# Patient Record
Sex: Female | Born: 1975 | Race: Black or African American | Hispanic: No | Marital: Single | Smoking: Never smoker
Health system: Southern US, Community
[De-identification: ages and names within clinical notes are randomized; demographics above are authoritative.]

## PROBLEM LIST (undated history)

## (undated) DIAGNOSIS — J45909 Unspecified asthma, uncomplicated: Secondary | ICD-10-CM

## (undated) DIAGNOSIS — T7840XA Allergy, unspecified, initial encounter: Secondary | ICD-10-CM

## (undated) DIAGNOSIS — F32A Depression, unspecified: Secondary | ICD-10-CM

## (undated) HISTORY — DX: Depression, unspecified: F32.A

## (undated) HISTORY — DX: Allergy, unspecified, initial encounter: T78.40XA

## (undated) HISTORY — DX: Unspecified asthma, uncomplicated: J45.909

---

## 1997-12-01 ENCOUNTER — Other Ambulatory Visit: Admission: RE | Admit: 1997-12-01 | Discharge: 1997-12-01 | Payer: Self-pay | Admitting: Obstetrics and Gynecology

## 1999-10-20 ENCOUNTER — Other Ambulatory Visit: Admission: RE | Admit: 1999-10-20 | Discharge: 1999-10-20 | Payer: Self-pay | Admitting: Obstetrics and Gynecology

## 2000-10-21 ENCOUNTER — Other Ambulatory Visit: Admission: RE | Admit: 2000-10-21 | Discharge: 2000-10-21 | Payer: Self-pay | Admitting: Obstetrics and Gynecology

## 2002-04-28 ENCOUNTER — Other Ambulatory Visit: Admission: RE | Admit: 2002-04-28 | Discharge: 2002-04-28 | Payer: Self-pay | Admitting: Obstetrics and Gynecology

## 2004-04-20 ENCOUNTER — Other Ambulatory Visit: Admission: RE | Admit: 2004-04-20 | Discharge: 2004-04-20 | Payer: Self-pay | Admitting: Obstetrics and Gynecology

## 2007-12-10 ENCOUNTER — Emergency Department (HOSPITAL_BASED_OUTPATIENT_CLINIC_OR_DEPARTMENT_OTHER): Admission: EM | Admit: 2007-12-10 | Discharge: 2007-12-10 | Payer: Self-pay | Admitting: Emergency Medicine

## 2009-06-17 ENCOUNTER — Emergency Department (HOSPITAL_BASED_OUTPATIENT_CLINIC_OR_DEPARTMENT_OTHER): Admission: EM | Admit: 2009-06-17 | Discharge: 2009-06-18 | Payer: Self-pay | Admitting: Emergency Medicine

## 2009-06-18 ENCOUNTER — Ambulatory Visit: Payer: Self-pay | Admitting: Diagnostic Radiology

## 2010-04-25 ENCOUNTER — Encounter (HOSPITAL_COMMUNITY)
Admission: RE | Admit: 2010-04-25 | Discharge: 2010-04-25 | Disposition: A | Discharge status: Home / Self Care | Payer: Federal, State, Local not specified - PPO | Source: Ambulatory Visit | Attending: Obstetrics and Gynecology | Admitting: Obstetrics and Gynecology

## 2010-04-25 DIAGNOSIS — Z01812 Encounter for preprocedural laboratory examination: Secondary | ICD-10-CM | POA: Insufficient documentation

## 2010-04-25 LAB — CBC
MCHC: 32.7 g/dL (ref 30.0–36.0)
MCV: 93.2 fL (ref 78.0–100.0)
WBC: 6.6 10*3/uL (ref 4.0–10.5)

## 2010-04-25 LAB — SURGICAL PCR SCREEN
MRSA, PCR: NEGATIVE
Staphylococcus aureus: NEGATIVE

## 2010-05-02 ENCOUNTER — Inpatient Hospital Stay (HOSPITAL_COMMUNITY)
Admission: RE | Admit: 2010-05-02 | Discharge: 2010-05-04 | DRG: 371 | Disposition: A | Discharge status: Home / Self Care | Payer: Federal, State, Local not specified - PPO | Source: Ambulatory Visit | Attending: Obstetrics and Gynecology | Admitting: Obstetrics and Gynecology

## 2010-05-02 DIAGNOSIS — O34219 Maternal care for unspecified type scar from previous cesarean delivery: Principal | ICD-10-CM | POA: Diagnosis present

## 2010-05-03 LAB — CBC
HCT: 27.7 % — ABNORMAL LOW (ref 36.0–46.0)
Hemoglobin: 9.2 g/dL — ABNORMAL LOW (ref 12.0–15.0)
MCH: 30.8 pg (ref 26.0–34.0)
MCHC: 33.2 g/dL (ref 30.0–36.0)
Platelets: 119 10*3/uL — ABNORMAL LOW (ref 150–400)
RDW: 14.1 % (ref 11.5–15.5)

## 2010-05-05 NOTE — Discharge Summary (Signed)
  NAMEGERMANY, DODGEN NO.:  [id number]  MEDICAL RECORD NO.:  [id number]           PATIENT TYPE:  I  LOCATION:  9112                          FACILITY:  [facility]  PHYSICIAN:  Gerrit Friends. Aldona Bar, M.D.   DATE OF BIRTH:  1976/02/07  DATE OF ADMISSION:  05/02/2010 DATE OF DISCHARGE:  05/04/2010                              DISCHARGE SUMMARY   DISCHARGE DIAGNOSES: 1. Term pregnancy delivered 8-pound 13-ounce female infant, Apgars 9 and     9. 2. Blood type B positive. 3. Previous cesarean section.  PROCEDURE:  Repeat low transverse cesarean section at term.  SUMMARY:  This 35 year old gravida 2 now para 2 was admitted at term for an elective repeat cesarean section which was carried out by Dr. Henderson Cloud on February 28 without difficulty with delivery of an 8-pound 13-ounce female infant with Apgars of 9 and 9.  The patient's postpartum course was totally uncomplicated.  She was breastfeeding without difficulty.  On the morning of March 1 she was very desirous of discharge.  She was ambulating well, tolerating a regular diet well, having normal bowel and bladder function, was afebrile, vital signs were stable.  Her breastfeeding was going well.  Her incision was clean and dry.  Her hemoglobin was 9.2 on February 29 with a white count of 8000 and a platelet count of 119,000.  It was felt being that this was only day 2, it will be appropriate to leave her staples in and she will return to the office for followup in 3 or 4 days to have her staples removed and wound Steri-Stripped.  She was given a staple remover and Steri-Strips to bring in the office and she will call for an appointment accordingly.  She was given an instruction brochure at the time of discharge and understood all instructions well. Discharge medications include vitamins 1 a day as long as she is breastfeeding, Feosol capsules in the equivalent thereof one daily until at least her postpartum visit, and she was  given prescriptions for Tylox to use 1-2 every 4-6 hours as needed for severe pain and Motrin 600 mg to use every 6 hours as needed for cramping or mild pain.  As mentioned, she was given an instruction brochure at the time of discharge and understood all instructions well.  CONDITION ON DISCHARGE:  Improved.     Gerrit Friends. Aldona Bar, M.D.     RMW/MEDQ  D:  05/04/2010  T:  05/04/2010  Job:  [id number]  Electronically Signed by Annamaria Helling M.D. on 05/05/2010 09:13:43 AM

## 2010-05-08 ENCOUNTER — Inpatient Hospital Stay (HOSPITAL_COMMUNITY): Admission: AD | Admit: 2010-05-08 | Payer: Self-pay | Source: Home / Self Care | Admitting: Obstetrics and Gynecology

## 2010-05-08 NOTE — Op Note (Signed)
NAMEGRACIE, GUPTA NO.:  [id number]  MEDICAL RECORD NO.:  [id number]           PATIENT TYPE:  I  LOCATION:  9112                          [facility]ACILITY:  [facility]  PHYSICIAN:  Carrington Clamp, M.D. DATE OF BIRTH:  [birth year]  DATE OF PROCEDURE:  05/02/2010 DATE OF DISCHARGE:                              OPERATIVE REPORT   PREOPERATIVE DIAGNOSIS:  Repeat cesarean section at term.  POSTOPERATIVE DIAGNOSIS:  Repeat cesarean section at term.  PROCEDURE:  Low transverse cesarean section.  SURGEON:  Carrington Clamp, MD  ASSISTANT:  Leilani Able, [facility]  ANESTHESIA:  Spinal.  FINDINGS:  Female infant 8 pounds 13 ounces, Apgars 9 and 9, vertex presentation.  Normal tubes, ovaries, and uterus were otherwise seen.  SPECIMENS:  None.  MEDICATIONS:  Ancef and Pitocin.  ESTIMATED BLOOD LOSS:  800 mL.  IV FLUIDS:  2300 mL.  URINE OUTPUT:  300 mL.  COMPLICATIONS:  None.  TECHNIQUE:  After adequate spinal anesthesia was achieved, the patient was prepped and draped in the usual sterile fashion in the dorsal lithotomy position.  A Pfannenstiel skin incision was made with the scalpel and carried down to the fascia with a Bovie cautery.  The fascia was incised in the midline and carried in a transverse curvilinear manner with the Mayo scissors.  The fascia was reflected superiorly and inferiorly from the rectus muscles.  The rectus muscles split in the midline.  Bowel free portion of peritoneum was then entered into carefully bluntly and the peritoneum was then extended superior and inferior manner with good visualization of the bowel and bladder.  The Alexis instrument was then placed and the vesicouterine fascia tented up and incised in a transverse curvilinear manner.  The bladder flap was created with sharp and blunt dissection.  A 2-cm incision was made in the upper portion of the lower uterine segment until clear fluid was noted on the amnion on entry to the  amnion.  The incision was extended manually and with the bandage scissors in a transverse curvilinear manner.  The baby was identified in the vertex presentation and delivered without complication.  The baby was bulb suctioned.  The cord was clamped and cut, and the baby was handed to awaiting pediatrics.  The placenta was then delivered manually and cord bloods were obtained by the cord blood donation staff.  The uterus was cleared of all debris, and the uterine incision was then closed with a running lock stitch of 0 Monocryl.  An imbricating layer of 0 Monocryl was performed as well.  The uterine incision was rendered hemostatic with the Bovie cautery and inspection of the ovaries and tubes indicated that they were normal.  Irrigation was then performed, and the peritoneum was then closed with a running stitch of 2-0 Vicryl.  This incorporated the rectus muscles as a separate layer.  The fascia was then closed with running stitch of 0 Vicryl.  The subcutaneous tissue was revised with Bovie cautery and then rendered hemostatic with irrigation and Bovie cautery.  The subcutaneous layer was then closed with interrupted stitches of 2-0 plain gut.  The skin was closed with staples.  The  patient tolerated the procedure well and was returned to recovery room in stable condition.     Carrington Clamp, M.D.     MH/MEDQ  D:  05/02/2010  T:  05/02/2010  Job:  [id number]  Electronically Signed by Carrington Clamp MD on 05/08/2010 08:28:49 AM

## 2010-05-23 LAB — BASIC METABOLIC PANEL
CO2: 25 mEq/L (ref 19–32)
Chloride: 104 mEq/L (ref 96–112)
GFR calc Af Amer: >60 mL/min (ref >60)
GFR calc non Af Amer: >60 mL/min (ref >60)
Potassium: 3.9 mEq/L (ref 3.5–5.1)

## 2010-05-23 LAB — CBC
HCT: 41.8 % (ref 36.0–46.0)
Hemoglobin: 14 g/dL (ref 12.0–15.0)
MCV: 93.3 fL (ref 78.0–100.0)
RDW: 12.5 % (ref 11.5–15.5)
WBC: 4.6 10*3/uL (ref 4.0–10.5)

## 2010-05-23 LAB — DIFFERENTIAL
Eosinophils Absolute: 0 10*3/uL (ref 0.0–0.7)
Lymphocytes Relative: 18 % (ref 12–46)
Lymphs Abs: 0.8 10*3/uL (ref 0.7–4.0)
Monocytes Relative: 11 % (ref 3–12)
Neutro Abs: 3.3 10*3/uL (ref 1.7–7.7)
Neutrophils Relative %: 70 % (ref 43–77)

## 2010-05-23 LAB — URINALYSIS, ROUTINE W REFLEX MICROSCOPIC
Leukocytes, UA: NEGATIVE
Nitrite: NEGATIVE
Specific Gravity, Urine: 1.03 (ref 1.005–1.030)
Urobilinogen, UA: 1 mg/dL (ref 0.0–1.0)

## 2010-05-23 LAB — PREGNANCY, URINE: Preg Test, Ur: NEGATIVE

## 2010-05-23 LAB — URINE MICROSCOPIC-ADD ON

## 2011-10-10 IMAGING — CR DG ABDOMEN ACUTE W/ 1V CHEST
3 series · 3 of 3 positions shown · non-contrast
Comparison: None.

CLINICAL DATA: Abdominal pain.  Nausea and vomiting.  Diarrhea.  1-
day history of these symptoms.

ACUTE ABDOMEN SERIES (ABDOMEN 2 VIEW & CHEST 1 VIEW) 06/18/2009:

[w chest pa]
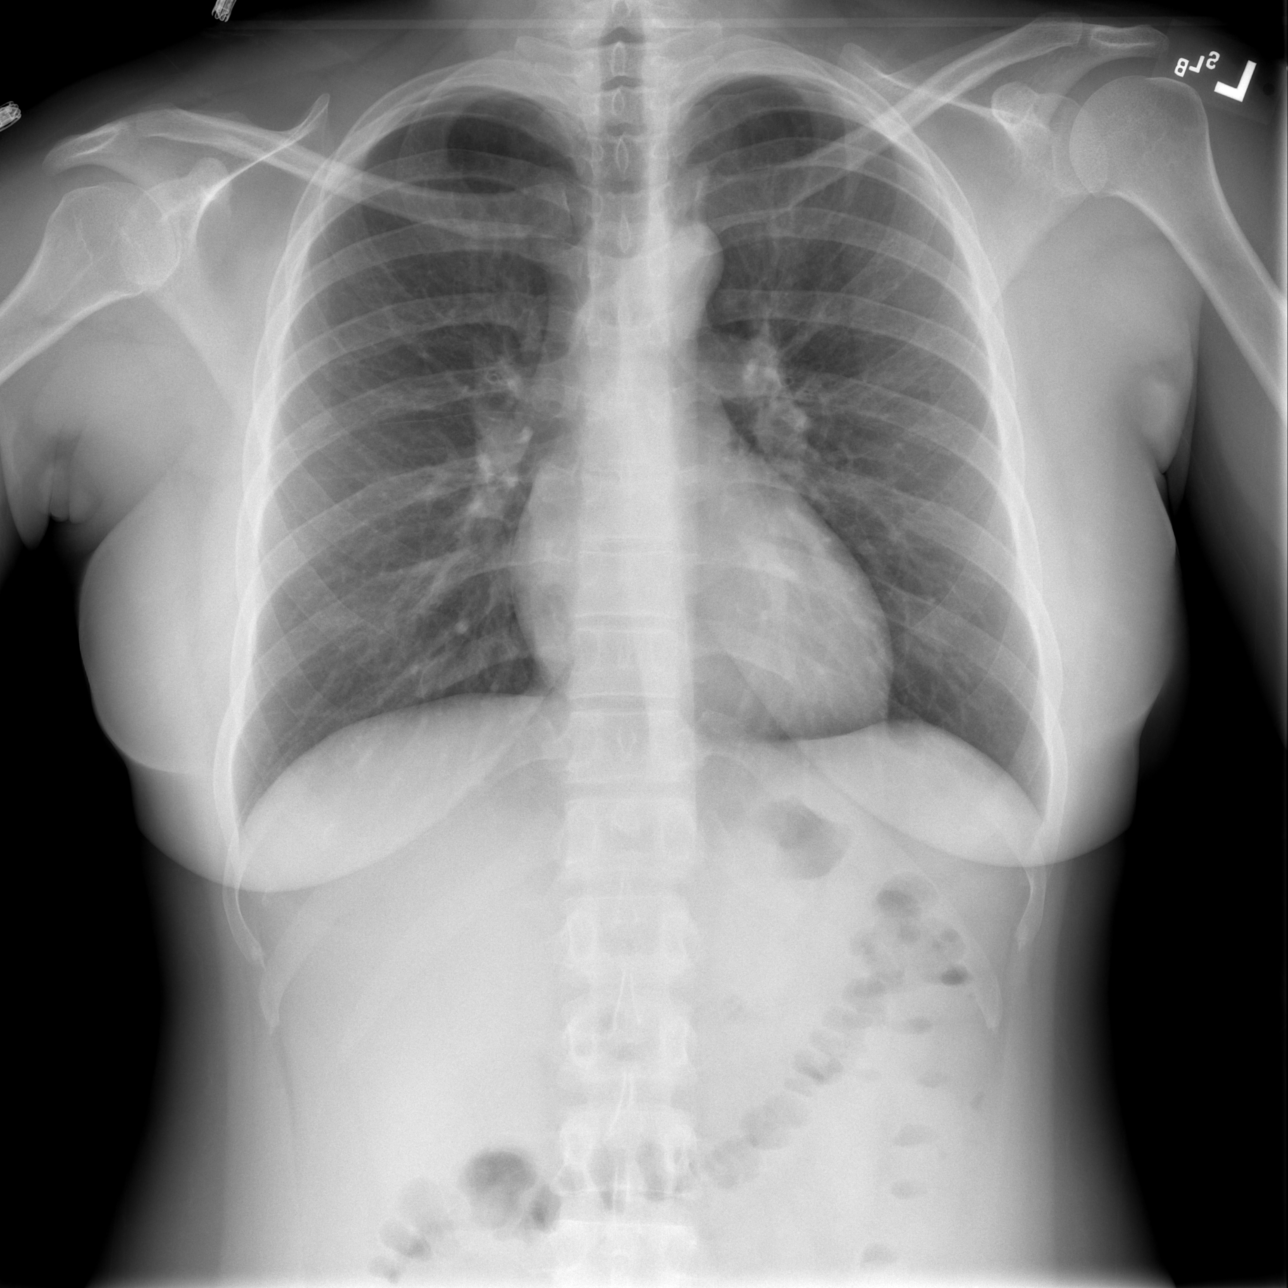

[w abdomen upright]
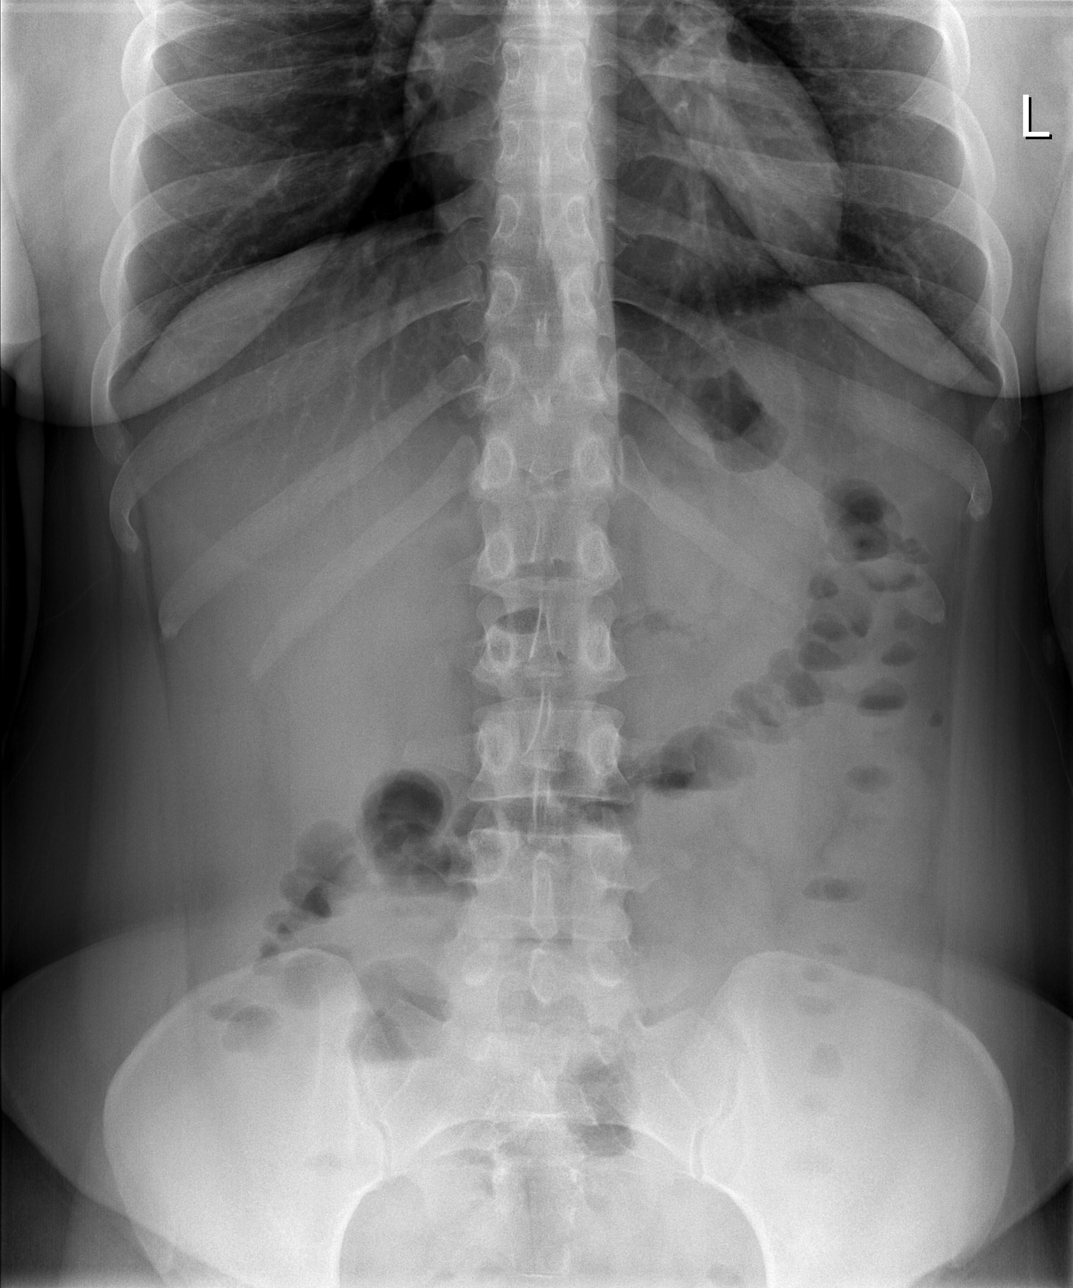

[t abdomen supine]
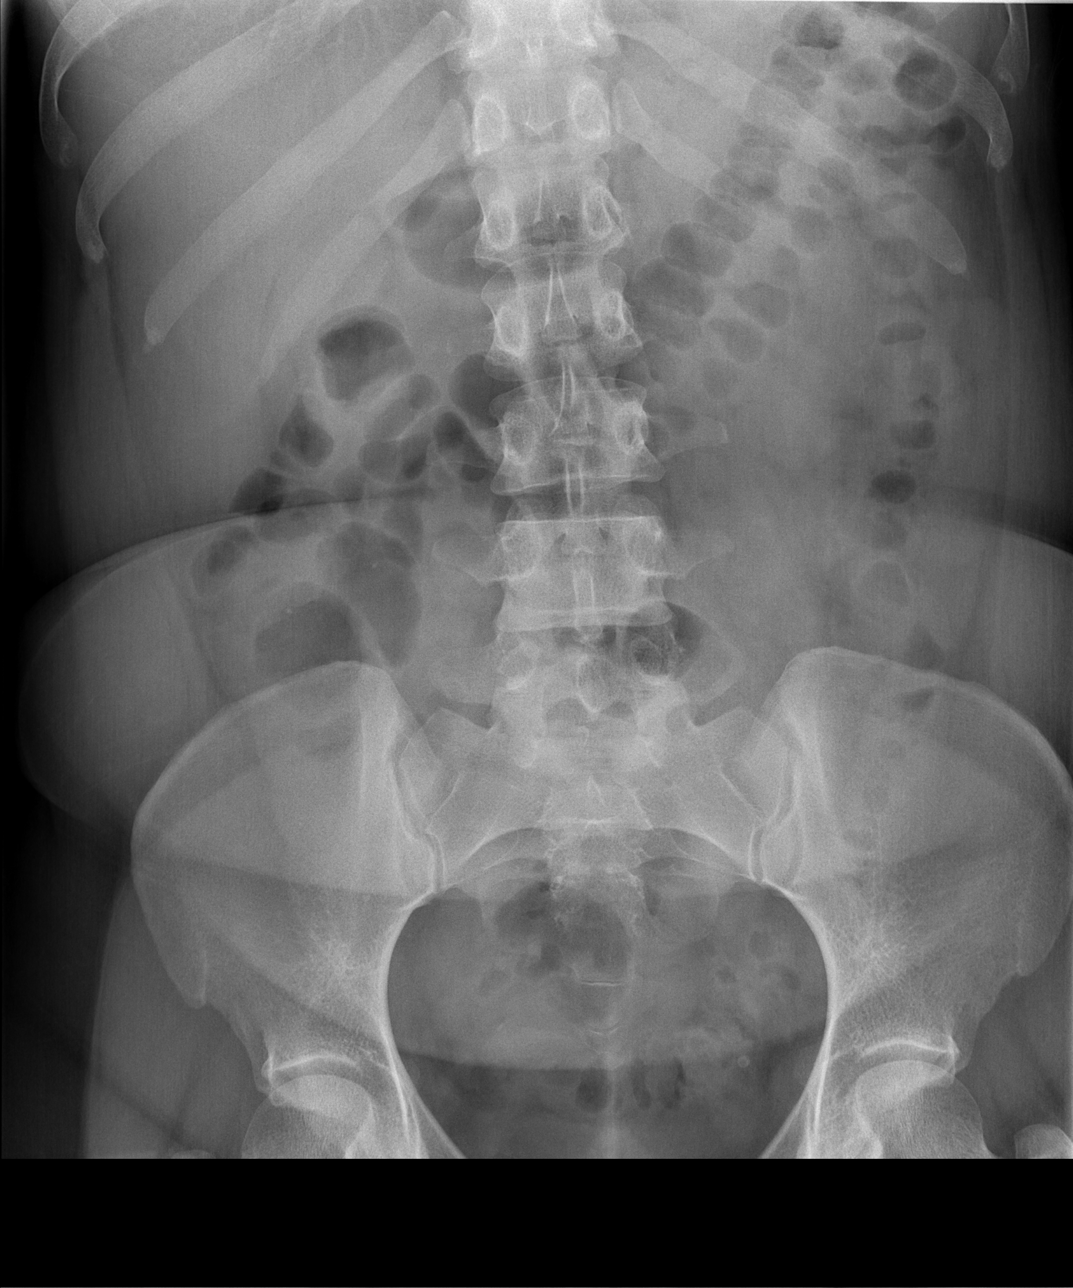

[3 of 3 positions shown; findings below may reference images not displayed]

FINDINGS: Bowel gas pattern unremarkable without evidence of
obstruction or significant ileus.  Air-fluid levels throughout the
colon consistent with liquid stool.  No evidence of free
intraperitoneal air or significant air fluid levels on the erect
image.  No abnormal calcifications.  Regional skeleton intact.

Cardiomediastinal silhouette unremarkable.  Lungs clear.  No
pleural effusions.
IMPRESSION: No acute abdominal or pulmonary abnormalities.  Liquid stool in the
colon consistent with the history of diarrhea.

## 2015-12-21 DIAGNOSIS — J45909 Unspecified asthma, uncomplicated: Secondary | ICD-10-CM | POA: Insufficient documentation

## 2016-12-20 DIAGNOSIS — Z01419 Encounter for gynecological examination (general) (routine) without abnormal findings: Secondary | ICD-10-CM | POA: Diagnosis not present

## 2016-12-20 DIAGNOSIS — Z124 Encounter for screening for malignant neoplasm of cervix: Secondary | ICD-10-CM | POA: Diagnosis not present

## 2016-12-20 DIAGNOSIS — Z6832 Body mass index (BMI) 32.0-32.9, adult: Secondary | ICD-10-CM | POA: Diagnosis not present

## 2017-05-28 DIAGNOSIS — K080 Exfoliation of teeth due to systemic causes: Secondary | ICD-10-CM | POA: Diagnosis not present

## 2017-07-02 DIAGNOSIS — K080 Exfoliation of teeth due to systemic causes: Secondary | ICD-10-CM | POA: Diagnosis not present

## 2017-12-17 DIAGNOSIS — K080 Exfoliation of teeth due to systemic causes: Secondary | ICD-10-CM | POA: Diagnosis not present

## 2018-04-07 DIAGNOSIS — S99922A Unspecified injury of left foot, initial encounter: Secondary | ICD-10-CM | POA: Diagnosis not present

## 2018-05-23 ENCOUNTER — Ambulatory Visit: Payer: Self-pay | Admitting: Family Medicine

## 2018-07-23 DIAGNOSIS — Z124 Encounter for screening for malignant neoplasm of cervix: Secondary | ICD-10-CM | POA: Diagnosis not present

## 2018-07-23 DIAGNOSIS — Z1231 Encounter for screening mammogram for malignant neoplasm of breast: Secondary | ICD-10-CM | POA: Diagnosis not present

## 2018-07-23 DIAGNOSIS — Z6836 Body mass index (BMI) 36.0-36.9, adult: Secondary | ICD-10-CM | POA: Diagnosis not present

## 2018-07-23 DIAGNOSIS — Z01419 Encounter for gynecological examination (general) (routine) without abnormal findings: Secondary | ICD-10-CM | POA: Diagnosis not present

## 2018-09-01 ENCOUNTER — Other Ambulatory Visit: Payer: Self-pay

## 2018-09-01 ENCOUNTER — Encounter: Payer: Self-pay | Admitting: Family Medicine

## 2018-09-01 ENCOUNTER — Ambulatory Visit: Payer: Federal, State, Local not specified - PPO | Admitting: Family Medicine

## 2018-09-01 VITALS — BP 127/79 | HR 89 | Temp 98.5°F | Resp 16 | Ht 61.0 in | Wt 196.4 lb

## 2018-09-01 DIAGNOSIS — Z1329 Encounter for screening for other suspected endocrine disorder: Secondary | ICD-10-CM

## 2018-09-01 DIAGNOSIS — Z13220 Encounter for screening for lipoid disorders: Secondary | ICD-10-CM | POA: Diagnosis not present

## 2018-09-01 DIAGNOSIS — J4520 Mild intermittent asthma, uncomplicated: Secondary | ICD-10-CM

## 2018-09-01 DIAGNOSIS — Z131 Encounter for screening for diabetes mellitus: Secondary | ICD-10-CM

## 2018-09-01 DIAGNOSIS — Z136 Encounter for screening for cardiovascular disorders: Secondary | ICD-10-CM

## 2018-09-01 DIAGNOSIS — Z6837 Body mass index (BMI) 37.0-37.9, adult: Secondary | ICD-10-CM

## 2018-09-01 DIAGNOSIS — E6609 Other obesity due to excess calories: Secondary | ICD-10-CM | POA: Diagnosis not present

## 2018-09-01 MED ORDER — ALBUTEROL SULFATE HFA 108 (90 BASE) MCG/ACT IN AERS: 1.0000 | INHALATION_SPRAY | Freq: Four times a day (QID) | RESPIRATORY_TRACT | 1 refills | Status: DC | PRN

## 2018-09-01 NOTE — Patient Instructions (Signed)
° ° ° °  If you have lab work done today you will be contacted with your lab results within the next 2 weeks.  If you have not heard from us then please contact us. The fastest way to get your results is to register for My Chart. ° ° °IF you received an x-ray today, you will receive an invoice from [facility]. Please contact [facility] at [phone] with questions or concerns regarding your invoice.  ° °IF you received labwork today, you will receive an invoice from [facility]. Please contact [facility] at [phone] with questions or concerns regarding your invoice.  ° °Our billing staff will not be able to assist you with questions regarding bills from these companies. ° °You will be contacted with the lab results as soon as they are available. The fastest way to get your results is to activate your My Chart account. Instructions are located on the last page of this paperwork. If you have not heard from us regarding the results in 2 weeks, please contact this office. °  ° ° ° °

## 2018-09-01 NOTE — Progress Notes (Signed)
New Patient Office Visit  Subjective:  Patient ID: Rebecca Kim, female    DOB: 07-06-1975  Age: 43 y.o. MRN: [id number]  CC:  Chief Complaint  Patient presents with  . Establish Care    has no other issues    HPI Rebecca Kim presents for   Obesity Pt reports that she has not been exercising She has Rebecca Kim 41yo girl and Rebecca Kim 41 yo boy She is not exercising  She works from home She denies any chest pain, nausea or vomiting Body mass index is 37.11 kg/m. She denies family history of heart disease or diabetes She denies history of gestational diabetes   MILD ASTHMA She has a mild intermittent asthma She uses the albuterol prn Denies any recent exacerbations She is a nonsmoker   Past Medical History:  Diagnosis Date  . Asthma     Past Surgical History:  Procedure Laterality Date  . CESAREAN SECTION     x2    History reviewed. No pertinent family history.  Social History   Socioeconomic History  . Marital status: Married    Spouse name: Not on file  . Number of children: Not on file  . Years of education: Not on file  . Highest education level: Not on file  Occupational History  . Not on file  Social Needs  . Financial resource strain: Not hard at all  . Food insecurity    Worry: Never true    Inability: Never true  . Transportation needs    Medical: No    Non-medical: No  Tobacco Use  . Smoking status: Never Smoker  . Smokeless tobacco: Never Used  Substance and Sexual Activity  . Alcohol use: Yes    Alcohol/week: 2.0 standard drinks    Types: 2 Glasses of wine per week  . Drug use: Never  . Sexual activity: Yes    Birth control/protection: I.U.D.  Lifestyle  . Physical activity    Days per week: 0 days    Minutes per session: 0 min  . Stress: Not at all  Relationships  . Social Herbalist on phone: Three times a week    Gets together: Three times a week    Attends religious service: Never    Active member of club or  organization: No    Attends meetings of clubs or organizations: Never    Relationship status: Married  . Intimate partner violence    Fear of current or ex partner: No    Emotionally abused: No    Physically abused: No    Forced sexual activity: No  Other Topics Concern  . Not on file  Social History Narrative  . Not on file    ROS Review of Systems Review of Systems  Constitutional: Negative for activity change, appetite change, chills and fever.  HENT: Negative for congestion, nosebleeds, trouble swallowing and voice change.   Respiratory: Negative for cough, shortness of breath and wheezing.   Gastrointestinal: Negative for diarrhea, nausea and vomiting.  Genitourinary: Negative for difficulty urinating, dysuria, flank pain and hematuria.  Musculoskeletal: Negative for back pain, joint swelling and neck pain.  Neurological: Negative for dizziness, speech difficulty, light-headedness and numbness.  See HPI. All other review of systems negative.   Objective:   Today's Vitals: BP 127/79 (BP Location: Right Arm, Patient Position: Sitting, Cuff Size: Normal)   Pulse 89   Temp 98.5 F (36.9 C) (Oral)   Resp 16   Ht 5\' 1"  (1.549 m)  Wt 196 lb 6.4 oz (89.1 kg)   SpO2 98%   BMI 37.11 kg/m   Physical Exam  Physical Exam  Constitutional: Oriented to person, place, and time. Appears well-developed and well-nourished.  HENT:  Head: Normocephalic and atraumatic.  Eyes: Conjunctivae and EOM are normal.  Neck: no thyromegaly, neck supple Cardiovascular: Normal rate, regular rhythm, normal heart sounds and intact distal pulses.  No murmur heard. Pulmonary/Chest: Effort normal and breath sounds normal. No stridor. No respiratory distress. Has no wheezes.  Neurological: Is alert and oriented to person, place, and time.  Skin: Skin is warm. Capillary refill takes less than 2 seconds.  Psychiatric: Has a normal mood and affect. Behavior is normal. Judgment and thought content  normal.   Assessment & Plan:   Problem List Items Addressed This Visit    None    Visit Diagnoses    Class 2 obesity due to excess calories without serious comorbidity with body mass index (BMI) of 37.0 to 37.9 in adult    -  Primary   Screening for diabetes mellitus       Relevant Orders   Hemoglobin A1c   Basic metabolic panel   Encounter for lipid screening for cardiovascular disease       Relevant Orders   Lipid panel   Screening for thyroid disorder       Relevant Orders   TSH     Will screen for diabetes and thyroid disease as well as cholesterol  For obesity- discussed goal of daily exercise for at least 15 minutes 3 times a day when possible Using chair exercises on youtube, doing some cardio and walking in the evenings  Mild intermittent asthma - refilled her inhaler today for prn use  Outpatient Encounter Medications as of 09/01/2018  Medication Sig  . albuterol (PROAIR HFA) 108 (90 Base) MCG/ACT inhaler ProAir HFA 90 mcg/actuation aerosol inhaler  . fluticasone (FLONASE) 50 MCG/ACT nasal spray fluticasone propionate 50 mcg/actuation nasal spray,suspension  . Fluticasone-Salmeterol (ADVAIR DISKUS) 250-50 MCG/DOSE AEPB Advair Diskus 250 mcg-50 mcg/dose powder for inhalation   No facility-administered encounter medications on file as of 09/01/2018.     Follow-up: No follow-ups on file.   Doristine BosworthZoe A Myrick Mcnairy, MD

## 2018-09-02 ENCOUNTER — Encounter: Payer: Self-pay | Admitting: Family Medicine

## 2018-09-02 LAB — BASIC METABOLIC PANEL
BUN/Creatinine Ratio: 10 (ref 9–23)
BUN: 7 mg/dL (ref 6–24)
CO2: 20 mmol/L (ref 20–29)
Calcium: 8.9 mg/dL (ref 8.7–10.2)
Chloride: 103 mmol/L (ref 96–106)
Creatinine, Ser: 0.68 mg/dL (ref 0.57–1.00)
GFR calc Af Amer: 126 mL/min/{1.73_m2} (ref >59)
GFR calc non Af Amer: 109 mL/min/{1.73_m2} (ref >59)
Glucose: 98 mg/dL (ref 65–99)
Potassium: 4.3 mmol/L (ref 3.5–5.2)
Sodium: 137 mmol/L (ref 134–144)

## 2018-09-02 LAB — LIPID PANEL
Chol/HDL Ratio: 2.8 ratio (ref 0.0–4.4)
Cholesterol, Total: 163 mg/dL (ref 100–199)
HDL: 58 mg/dL (ref >39)
LDL Calculated: 96 mg/dL (ref 0–99)
Triglycerides: 43 mg/dL (ref 0–149)
VLDL Cholesterol Cal: 9 mg/dL (ref 5–40)

## 2018-09-02 LAB — HEMOGLOBIN A1C
Est. average glucose Bld gHb Est-mCnc: 100 mg/dL
Hgb A1c MFr Bld: 5.1 % (ref 4.8–5.6)

## 2018-09-02 LAB — TSH: TSH: 1.39 u[IU]/mL (ref 0.450–4.500)

## 2018-09-24 DIAGNOSIS — Z20828 Contact with and (suspected) exposure to other viral communicable diseases: Secondary | ICD-10-CM | POA: Diagnosis not present

## 2018-12-25 ENCOUNTER — Encounter: Payer: Self-pay | Admitting: Family Medicine

## 2019-01-22 ENCOUNTER — Ambulatory Visit (INDEPENDENT_AMBULATORY_CARE_PROVIDER_SITE_OTHER): Payer: Federal, State, Local not specified - PPO | Admitting: Family Medicine

## 2019-01-22 ENCOUNTER — Other Ambulatory Visit: Payer: Self-pay

## 2019-01-22 DIAGNOSIS — Z23 Encounter for immunization: Secondary | ICD-10-CM | POA: Diagnosis not present

## 2019-01-26 DIAGNOSIS — Z20828 Contact with and (suspected) exposure to other viral communicable diseases: Secondary | ICD-10-CM | POA: Diagnosis not present

## 2019-03-26 ENCOUNTER — Encounter: Payer: Self-pay | Admitting: Family Medicine

## 2019-07-07 ENCOUNTER — Encounter: Payer: Self-pay | Admitting: Sports Medicine

## 2019-07-07 ENCOUNTER — Ambulatory Visit: Payer: Federal, State, Local not specified - PPO | Admitting: Sports Medicine

## 2019-07-07 ENCOUNTER — Other Ambulatory Visit: Payer: Self-pay

## 2019-07-07 ENCOUNTER — Other Ambulatory Visit: Payer: Self-pay | Admitting: Sports Medicine

## 2019-07-07 ENCOUNTER — Ambulatory Visit (INDEPENDENT_AMBULATORY_CARE_PROVIDER_SITE_OTHER): Payer: Federal, State, Local not specified - PPO

## 2019-07-07 VITALS — Temp 97.1°F

## 2019-07-07 DIAGNOSIS — M722 Plantar fascial fibromatosis: Secondary | ICD-10-CM

## 2019-07-07 DIAGNOSIS — M79672 Pain in left foot: Secondary | ICD-10-CM

## 2019-07-07 DIAGNOSIS — M2140 Flat foot [pes planus] (acquired), unspecified foot: Secondary | ICD-10-CM | POA: Diagnosis not present

## 2019-07-07 MED ORDER — TRIAMCINOLONE ACETONIDE 10 MG/ML IJ SUSP
10.0000 mg | Freq: Once | INTRAMUSCULAR | Status: AC
  Administered 2019-07-07: 21:00:00 10 mg

## 2019-07-07 NOTE — Progress Notes (Signed)
Subjective: Rebecca Kim is a 44 y.o. female patient presents to office with complaint of moderate heel pain on the left for the last 2 months. Patient admits to post static dyskinesia and pain with walking. History of plantar fascial release 21 years ago bilateral but most recently started to have pain again since working from home and walking barefoot. Patient has treated this problem with stretching, change in shoes and Dr. Jari Sportsman insoles with no relief. Denies any other pedal complaints.   Review of Systems  All other systems reviewed and are negative.   Patient Active Problem List   Diagnosis Date Noted  . Asthma 12/21/2015  . Routine health maintenance 11/18/2010  . Breastfeeding (infant) 11/02/2010    Current Outpatient Medications on File Prior to Visit  Medication Sig Dispense Refill  . albuterol (PROAIR HFA) 108 (90 Base) MCG/ACT inhaler Inhale 1-2 puffs into the lungs every 6 (six) hours as needed for wheezing or shortness of breath. 18 g 1  . fluticasone (FLONASE) 50 MCG/ACT nasal spray fluticasone propionate 50 mcg/actuation nasal spray,suspension    . Fluticasone-Salmeterol (ADVAIR DISKUS) 250-50 MCG/DOSE AEPB Advair Diskus 250 mcg-50 mcg/dose powder for inhalation    . levonorgestrel (MIRENA, 52 MG,) 20 MCG/24HR IUD Mirena 20 mcg/24 hours (6 yrs) 52 mg intrauterine device  Take 1 device by intrauterine route.     No current facility-administered medications on file prior to visit.    Allergies  Allergen Reactions  . Pineapple Hives    Objective: Physical Exam General: The patient is alert and oriented x3 in no acute distress.  Dermatology: Skin is warm, dry and supple bilateral lower extremities. Nails 1-10 are normal. There is no erythema, edema, no eccymosis, no open lesions present. Integument is otherwise unremarkable.  Vascular: Dorsalis Pedis pulse and Posterior Tibial pulse are 2/4 bilateral. Capillary fill time is immediate to all  digits.  Neurological: Grossly intact to light touch with an achilles reflex of +2/5 and a  negative Tinel's sign bilateral.  Musculoskeletal: Tenderness to palpation at the medial calcaneal tubercale and through the insertion of the plantar fascia on the left foot. No pain with compression of calcaneus bilateral. No pain with tuning fork to calcaneus bilateral. No pain with calf compression bilateral. There is decreased Ankle joint range of motion bilateral. All other joints range of motion within normal limits bilateral. + Pes planus foot type. Strength 5/5 in all groups bilateral.   Gait: Unassisted, Antalgic avoid weight on left heel  Xray, Left foot:  Normal osseous mineralization. Joint spaces preserved. No fracture/dislocation/boney destruction. Calcaneal spur present with mild thickening of plantar fascia. No other soft tissue abnormalities or radiopaque foreign bodies.   Assessment and Plan: Problem List Items Addressed This Visit    None    Visit Diagnoses    Pain in left foot    -  Primary   Plantar fasciitis, left       Pes planus, unspecified laterality          -Complete examination performed.  -Xrays reviewed -Discussed with patient in detail the condition of plantar fasciitis, how this occurs and general treatment options. Explained both conservative and surgical treatments.  -After oral consent and aseptic prep, injected a mixture containing 1 ml of 2%  plain lidocaine, 1 ml 0.5% plain marcaine, 0.5 ml of kenalog 10 and 0.5 ml of dexamethasone phosphate into left heel. Post-injection care discussed with patient.  -Call if no better in 1-2 weeks for a Rx of Medrol to be sent  to pharmacy  -Recommended good supportive shoes and advised use of OTC insert. Explained to patient that if these orthoses work well, we will continue with these. If these do not improve her condition and  pain, we will consider custom molded orthoses. -Dispensed left plantar fascial brace and  heel  lifts bilateral  -Explained and dispensed to patient daily stretching exercises. -Recommend patient to ice affected area 1-2x daily. -Patient to return to office in 4 weeks for follow up or sooner if problems or questions arise.  Landis Martins, DPM

## 2019-07-28 ENCOUNTER — Ambulatory Visit: Payer: Federal, State, Local not specified - PPO | Admitting: Sports Medicine

## 2019-07-28 ENCOUNTER — Encounter: Payer: Self-pay | Admitting: Sports Medicine

## 2019-07-28 ENCOUNTER — Other Ambulatory Visit: Payer: Self-pay

## 2019-07-28 VITALS — Temp 97.7°F

## 2019-07-28 DIAGNOSIS — M79672 Pain in left foot: Secondary | ICD-10-CM

## 2019-07-28 DIAGNOSIS — M2140 Flat foot [pes planus] (acquired), unspecified foot: Secondary | ICD-10-CM

## 2019-07-28 DIAGNOSIS — M722 Plantar fascial fibromatosis: Secondary | ICD-10-CM

## 2019-07-28 NOTE — Progress Notes (Signed)
Subjective: Rebecca Kim is a 44 y.o. female patient returns to office for follow-up evaluation of left greater than right heel pain.  Patient reports that overall she is feeling better stretching using her rolling while wearing supportive shoes and reports that the injection last visit seem to help.  Patient reports that when she was here last visit she did not get her heel lifts or her fascial brace for the left.  Patient denies any other pedal complaints.   Patient Active Problem List   Diagnosis Date Noted  . Asthma 12/21/2015  . Routine health maintenance 11/18/2010  . Breastfeeding (infant) 11/02/2010    Current Outpatient Medications on File Prior to Visit  Medication Sig Dispense Refill  . albuterol (PROAIR HFA) 108 (90 Base) MCG/ACT inhaler Inhale 1-2 puffs into the lungs every 6 (six) hours as needed for wheezing or shortness of breath. 18 g 1  . fluticasone (FLONASE) 50 MCG/ACT nasal spray fluticasone propionate 50 mcg/actuation nasal spray,suspension    . Fluticasone-Salmeterol (ADVAIR DISKUS) 250-50 MCG/DOSE AEPB Advair Diskus 250 mcg-50 mcg/dose powder for inhalation    . levonorgestrel (MIRENA, 52 MG,) 20 MCG/24HR IUD Mirena 20 mcg/24 hours (6 yrs) 52 mg intrauterine device  Take 1 device by intrauterine route.     No current facility-administered medications on file prior to visit.    Allergies  Allergen Reactions  . Pineapple Hives    Objective: Physical Exam General: The patient is alert and oriented x3 in no acute distress.  Dermatology: Skin is warm, dry and supple bilateral lower extremities. Nails 1-10 are normal. There is no erythema, edema, no eccymosis, no open lesions present. Integument is otherwise unremarkable.  Vascular: Dorsalis Pedis pulse and Posterior Tibial pulse are 2/4 bilateral. Capillary fill time is immediate to all digits.  Neurological: Grossly intact to light touch with an achilles reflex of +2/5 and a  negative Tinel's sign  bilateral.  Musculoskeletal: Decreased tenderness to palpation at the medial calcaneal tubercale and through the insertion of the plantar fascia on the left foot. No pain with compression of calcaneus bilateral. No pain with tuning fork to calcaneus bilateral. No pain with calf compression bilateral. There is decreased Ankle joint range of motion bilateral. All other joints range of motion within normal limits bilateral. + Pes planus foot type. Strength 5/5 in all groups bilateral.    Assessment and Plan: Problem List Items Addressed This Visit    None    Visit Diagnoses    Pain in left foot    -  Primary   Plantar fasciitis, left       Pes planus, unspecified laterality          -Complete examination performed.  -Re-discussed long-term care for plantar fasciitis left greater than right secondary to foot type pes planus. -Dispensed plantar fascial brace and heel lifts with instructions on proper use; advised patient to use plantar fascial brace daily for the next month and then after 1 month if pain and symptoms are much improved may slowly wean from the brace -Call if no better in 1-2 weeks for a Rx of Medrol to be sent to pharmacy  -Recommended daily stretching, good supportive shoes, and activities to tolerance -Recommend custom functional foot orthotics office to call patient after verifying benefits to arrange appointment for casting for orthotics -Recommend patient to ice affected area 1-2x daily. -Patient to return to office as scheduled or sooner if problems or questions arise.  Asencion Islam, DPM

## 2019-08-04 DIAGNOSIS — Z124 Encounter for screening for malignant neoplasm of cervix: Secondary | ICD-10-CM | POA: Diagnosis not present

## 2019-08-05 DIAGNOSIS — Z6837 Body mass index (BMI) 37.0-37.9, adult: Secondary | ICD-10-CM | POA: Diagnosis not present

## 2019-08-05 DIAGNOSIS — Z124 Encounter for screening for malignant neoplasm of cervix: Secondary | ICD-10-CM | POA: Diagnosis not present

## 2019-08-05 DIAGNOSIS — Z1231 Encounter for screening mammogram for malignant neoplasm of breast: Secondary | ICD-10-CM | POA: Diagnosis not present

## 2019-08-05 DIAGNOSIS — Z01419 Encounter for gynecological examination (general) (routine) without abnormal findings: Secondary | ICD-10-CM | POA: Diagnosis not present

## 2019-08-05 LAB — HM PAP SMEAR

## 2019-08-13 ENCOUNTER — Ambulatory Visit (INDEPENDENT_AMBULATORY_CARE_PROVIDER_SITE_OTHER): Payer: Federal, State, Local not specified - PPO | Admitting: Orthotics

## 2019-08-13 ENCOUNTER — Other Ambulatory Visit: Payer: Self-pay

## 2019-08-13 DIAGNOSIS — M2140 Flat foot [pes planus] (acquired), unspecified foot: Secondary | ICD-10-CM | POA: Diagnosis not present

## 2019-08-13 DIAGNOSIS — M722 Plantar fascial fibromatosis: Secondary | ICD-10-CM

## 2019-08-13 NOTE — Progress Notes (Addendum)
Patient was cast today for CMFO to address left foot pain.

## 2019-08-25 NOTE — Addendum Note (Signed)
Addended by: Ria Clock on: 08/25/2019 08:57 AM   Modules accepted: Level of Service

## 2019-09-01 DIAGNOSIS — T2122XA Burn of second degree of abdominal wall, initial encounter: Secondary | ICD-10-CM | POA: Diagnosis not present

## 2019-09-03 ENCOUNTER — Ambulatory Visit: Payer: Federal, State, Local not specified - PPO | Admitting: Orthotics

## 2019-09-03 ENCOUNTER — Other Ambulatory Visit: Payer: Self-pay

## 2019-09-03 DIAGNOSIS — M2140 Flat foot [pes planus] (acquired), unspecified foot: Secondary | ICD-10-CM

## 2019-09-03 DIAGNOSIS — M722 Plantar fascial fibromatosis: Secondary | ICD-10-CM

## 2019-09-03 NOTE — Progress Notes (Signed)
Patient came in today to pick up custom made foot orthotics.  The goals were accomplished and the patient reported no dissatisfaction with said orthotics.  Patient was advised of breakin period and how to report any issues. 

## 2019-09-09 DIAGNOSIS — J4520 Mild intermittent asthma, uncomplicated: Secondary | ICD-10-CM | POA: Diagnosis not present

## 2019-09-09 DIAGNOSIS — T300 Burn of unspecified body region, unspecified degree: Secondary | ICD-10-CM | POA: Diagnosis not present

## 2019-09-23 DIAGNOSIS — E041 Nontoxic single thyroid nodule: Secondary | ICD-10-CM | POA: Diagnosis not present

## 2019-09-23 DIAGNOSIS — E6609 Other obesity due to excess calories: Secondary | ICD-10-CM | POA: Diagnosis not present

## 2019-09-23 DIAGNOSIS — J4520 Mild intermittent asthma, uncomplicated: Secondary | ICD-10-CM | POA: Diagnosis not present

## 2019-10-09 ENCOUNTER — Other Ambulatory Visit: Payer: Self-pay | Admitting: Family Medicine

## 2019-10-09 DIAGNOSIS — E041 Nontoxic single thyroid nodule: Secondary | ICD-10-CM

## 2019-10-14 ENCOUNTER — Ambulatory Visit
Admission: RE | Admit: 2019-10-14 | Discharge: 2019-10-14 | Disposition: A | Discharge status: Home / Self Care | Payer: Federal, State, Local not specified - PPO | Source: Ambulatory Visit | Attending: Family Medicine | Admitting: Family Medicine

## 2019-10-14 DIAGNOSIS — E041 Nontoxic single thyroid nodule: Secondary | ICD-10-CM | POA: Diagnosis not present

## 2019-12-09 DIAGNOSIS — Z03818 Encounter for observation for suspected exposure to other biological agents ruled out: Secondary | ICD-10-CM | POA: Diagnosis not present

## 2020-08-24 DIAGNOSIS — Z01419 Encounter for gynecological examination (general) (routine) without abnormal findings: Secondary | ICD-10-CM | POA: Diagnosis not present

## 2020-08-24 DIAGNOSIS — Z1231 Encounter for screening mammogram for malignant neoplasm of breast: Secondary | ICD-10-CM | POA: Diagnosis not present

## 2020-08-24 LAB — HM MAMMOGRAPHY

## 2020-09-29 DIAGNOSIS — R062 Wheezing: Secondary | ICD-10-CM | POA: Diagnosis not present

## 2020-09-29 DIAGNOSIS — U071 COVID-19: Secondary | ICD-10-CM | POA: Diagnosis not present

## 2020-12-28 DIAGNOSIS — K080 Exfoliation of teeth due to systemic causes: Secondary | ICD-10-CM | POA: Diagnosis not present

## 2021-03-28 ENCOUNTER — Encounter: Payer: Self-pay | Admitting: Nurse Practitioner

## 2021-03-28 ENCOUNTER — Ambulatory Visit: Payer: Federal, State, Local not specified - PPO | Admitting: Nurse Practitioner

## 2021-03-28 ENCOUNTER — Other Ambulatory Visit: Payer: Self-pay

## 2021-03-28 VITALS — BP 118/76 | HR 90 | Temp 97.5°F | Ht 62.0 in | Wt 187.6 lb

## 2021-03-28 DIAGNOSIS — Z13220 Encounter for screening for lipoid disorders: Secondary | ICD-10-CM

## 2021-03-28 DIAGNOSIS — E6609 Other obesity due to excess calories: Secondary | ICD-10-CM

## 2021-03-28 DIAGNOSIS — Z0282 Encounter for adoption services: Secondary | ICD-10-CM | POA: Insufficient documentation

## 2021-03-28 DIAGNOSIS — Z136 Encounter for screening for cardiovascular disorders: Secondary | ICD-10-CM

## 2021-03-28 DIAGNOSIS — Z6834 Body mass index (BMI) 34.0-34.9, adult: Secondary | ICD-10-CM

## 2021-03-28 DIAGNOSIS — Z0000 Encounter for general adult medical examination without abnormal findings: Secondary | ICD-10-CM

## 2021-03-28 DIAGNOSIS — Z975 Presence of (intrauterine) contraceptive device: Secondary | ICD-10-CM | POA: Insufficient documentation

## 2021-03-28 DIAGNOSIS — Z23 Encounter for immunization: Secondary | ICD-10-CM

## 2021-03-28 LAB — LIPID PANEL
Cholesterol: 178 mg/dL (ref 0–200)
HDL: 54.6 mg/dL (ref >39.00)
LDL Cholesterol: 117 mg/dL — ABNORMAL HIGH (ref 0–99)
NonHDL: 123.01
Total CHOL/HDL Ratio: 3
Triglycerides: 32 mg/dL (ref 0.0–149.0)
VLDL: 6.4 mg/dL (ref 0.0–40.0)

## 2021-03-28 LAB — CBC WITH DIFFERENTIAL/PLATELET
Basophils Absolute: 0 10*3/uL (ref 0.0–0.1)
Basophils Relative: 1.1 % (ref 0.0–3.0)
Eosinophils Absolute: 0.1 10*3/uL (ref 0.0–0.7)
Eosinophils Relative: 1.7 % (ref 0.0–5.0)
HCT: 40.1 % (ref 36.0–46.0)
Hemoglobin: 13.1 g/dL (ref 12.0–15.0)
Lymphocytes Relative: 56.1 % — ABNORMAL HIGH (ref 12.0–46.0)
Lymphs Abs: 2.2 10*3/uL (ref 0.7–4.0)
MCHC: 32.7 g/dL (ref 30.0–36.0)
MCV: 94.4 fl (ref 78.0–100.0)
Monocytes Absolute: 0.5 10*3/uL (ref 0.1–1.0)
Monocytes Relative: 14.3 % — ABNORMAL HIGH (ref 3.0–12.0)
Neutro Abs: 1 10*3/uL — ABNORMAL LOW (ref 1.4–7.7)
Neutrophils Relative %: 26.8 % — ABNORMAL LOW (ref 43.0–77.0)
Platelets: 214 10*3/uL (ref 150.0–400.0)
RBC: 4.25 Mil/uL (ref 3.87–5.11)
RDW: 13.3 % (ref 11.5–15.5)
WBC: 3.8 10*3/uL — ABNORMAL LOW (ref 4.0–10.5)

## 2021-03-28 LAB — COMPREHENSIVE METABOLIC PANEL
ALT: 18 U/L (ref 0–35)
AST: 18 U/L (ref 0–37)
Albumin: 4.5 g/dL (ref 3.5–5.2)
Alkaline Phosphatase: 59 U/L (ref 39–117)
BUN: 8 mg/dL (ref 6–23)
CO2: 28 mEq/L (ref 19–32)
Calcium: 9.4 mg/dL (ref 8.4–10.5)
Chloride: 103 mEq/L (ref 96–112)
Creatinine, Ser: 0.71 mg/dL (ref 0.40–1.20)
GFR: 103.94 mL/min (ref >60.00)
Glucose, Bld: 86 mg/dL (ref 70–99)
Potassium: 4 mEq/L (ref 3.5–5.1)
Sodium: 139 mEq/L (ref 135–145)
Total Bilirubin: 0.9 mg/dL (ref 0.2–1.2)
Total Protein: 7.7 g/dL (ref 6.0–8.3)

## 2021-03-28 LAB — TSH: TSH: 1.25 u[IU]/mL (ref 0.35–5.50)

## 2021-03-28 NOTE — Patient Instructions (Signed)
Go to lab for blood draw  Sign medical release to get records from GYN.  Start regular exercise ( per week)  How to Increase Your Level of Physical Activity Getting regular physical activity is important for your overall health and well-being. Most people do not get enough exercise. There are easy ways to increase your level of physical activity, even if you have not been very active in the past or if you are just starting out. What are the benefits of physical activity? Physical activity has many short-term and long-term benefits. Being active on a regular basis can improve your physical and mental health as well as provide other benefits. Physical health benefits Helping you lose weight or maintain a healthy weight. Strengthening your muscles and bones. Reducing your risk of certain long-term (chronic) diseases, including heart disease, cancer, and diabetes. Being able to move around more easily and for longer periods of time without getting tired (increased endurance or stamina). Improving your ability to fight off illness (enhanced immunity). Being able to sleep better. Helping you stay healthy as you get older, including: Helping you stay mobile, or capable of walking and moving around. Preventing accidents, such as falls. Increasing life expectancy. Mental health benefits Boosting your mood and improving your self-esteem. Lowering your chance of having mental health problems, such as depression or anxiety. Helping you feel good about your body. Other benefits Finding new sources of fun and enjoyment. Meeting new people who share a common interest. Before you begin If you have a chronic illness or have not been active for a while, check with your health care provider about how to get started. Ask your health care provider what activities are safe for you. Start out slowly. Walking or doing some simple chair exercises is a good place to start, especially if you have not been  active before or for a long time. Set goals that you can work toward. Ask your health care provider how much exercise is best for you. In general, most adults should: Do moderate-intensity exercise for at least 150 minutes each week (30 minutes on most days of the week) or vigorous exercise for at least 75 minutes each week, or a combination of these. Moderate-intensity exercise can include walking at a quick pace, biking, yoga, water aerobics, or gardening. Vigorous exercise involves activities that take more effort, such as jogging or running, playing sports, swimming laps, or jumping rope. Do strength exercises on at least 2 days each week. This can include weight lifting, body weight exercises, and resistance-band exercises. How to be more physically active Make a plan  Try to find activities that you enjoy. You are more likely to commit to an exercise routine if it does not feel like a chore. If you have bone or joint problems, choose low-impact exercises, like walking or swimming. Use these tips for being successful with an exercise plan: Find a workout partner for accountability. Join a group or class, such as an aerobics class, cycling class, or sports team. Make family time active. Go for a walk, bike, or swim. Include a variety of exercises each week. Consider using a fitness tracker, such as a mobile phone app or a device worn like a watch, that will count the number of steps you take each day. Many people strive to reach 10,000 steps a day. Find ways to be active in your daily routines Besides your formal exercise plans, you can find ways to do physical activity during your daily routines, such as: Walking  or biking to work or to the store. Taking the stairs instead of the elevator. Parking farther away from the door at work or at the store. Planning walking meetings. Walking around while you are on the phone. Where to find more information Centers for Disease Control and  Prevention: [employer]'s Council on Fitness, Sports & Nutrition: www.fitness.gov ChooseMyPlate: http://www.harvey.com/ Contact a health care provider if: You have headaches, muscle aches, or joint pain that is concerning. You feel dizzy or light-headed while exercising. You faint. You feel your heart skipping, racing, or fluttering. You have chest pain while exercising. Summary Exercise benefits your mind and body at any age, even if you are just starting out. If you have a chronic illness or have not been active for a while, check with your health care provider before increasing your physical activity. Choose activities that are safe and enjoyable for you. Ask your health care provider what activities are safe for you. Start slowly. Tell your health care provider if you have problems as you start to increase your activity level. This information is not intended to replace advice given to you by your health care provider. Make sure you discuss any questions you have with your health care provider. Document Revised: 06/17/2020 Document Reviewed: 06/17/2020 Elsevier Patient Education  2022 [employer].

## 2021-03-28 NOTE — Progress Notes (Signed)
Subjective:    Patient ID: Rebecca Kim, female    DOB: [birth year], 46 y.o.   MRN: [id number]  Patient presents today for CPE   HPI Class 1 obesity due to excess calories without serious comorbidity with body mass index (BMI) of 34.0 to 34.9 in adult Advised about the need for regular exercise and heart health meal choices. Provided printed information.  Wt Readings from Last 3 Encounters:  03/28/21 187 lb 9.6 oz (85.1 kg)  09/01/18 196 lb 6.4 oz (89.1 kg)   Vision:up to date Dental:up to date Diet:regular Exercise:none Weight:  Wt Readings from Last 3 Encounters:  03/28/21 187 lb 9.6 oz (85.1 kg)  09/01/18 196 lb 6.4 oz (89.1 kg)    Sexual History (orientation,birth control, marital status, STD): up to date with PAP and mammogram, completed by GYN, no need for STD screen  Depression/Suicide: Depression screen [facility] 2/9 03/28/2021 09/01/2018  Decreased Interest 1 0  Down, Depressed, Hopeless 1 0  PHQ - 2 Score 2 0  Altered sleeping 1 -  Tired, decreased energy 1 -  Change in appetite 2 -  Feeling bad or failure about yourself  1 -  Trouble concentrating 1 -  Moving slowly or fidgety/restless 0 -  Suicidal thoughts 0 -  PHQ-9 Score 8 -  Difficult doing work/chores Somewhat difficult -   GAD 7 : Generalized Anxiety Score 03/28/2021  Nervous, Anxious, on Edge 1  Control/stop worrying 2  Worry too much - different things 2  Trouble relaxing 0  Restless 1  Easily annoyed or irritable 0  Afraid - awful might happen 0  Total GAD 7 Score 6  Anxiety Difficulty Somewhat difficult   Immunizations: (TDAP, Hep C screen, Pneumovax, Influenza, zoster)  Health Maintenance  Topic Date Due   Pap Smear  Never done   Hepatitis C Screening: USPSTF Recommendation to screen - Ages 18-79 yo.  03/28/2022*   HIV Screening  03/28/2022*   COVID-19 Vaccine (5 - Booster for Moderna series) 04/13/2021   Tetanus Vaccine  03/29/2031   Flu Shot  Completed   HPV Vaccine  Aged Out  *Topic  was postponed. The date shown is not the original due date.   Fall Risk: Fall Risk  03/28/2021  Falls in the past year? 0  Number falls in past yr: 0  Injury with Fall? 0  Risk for fall due to : No Fall Risks  Follow up Falls evaluation completed   Medications and allergies reviewed with patient and updated if appropriate.  Patient Active Problem List   Diagnosis Date Noted   IUD (intrauterine device) in place 03/28/2021   Class 1 obesity due to excess calories without serious comorbidity with body mass index (BMI) of 34.0 to 34.9 in adult 03/28/2021   Adopted 03/28/2021   Asthma 12/21/2015    Current Outpatient Medications on File Prior to Visit  Medication Sig Dispense Refill   albuterol (PROAIR HFA) 108 (90 Base) MCG/ACT inhaler Inhale 1-2 puffs into the lungs every 6 (six) hours as needed for wheezing or shortness of breath. 18 g 1   fluticasone (FLONASE) 50 MCG/ACT nasal spray fluticasone propionate 50 mcg/actuation nasal spray,suspension     Fluticasone-Salmeterol (ADVAIR) 250-50 MCG/DOSE AEPB Advair Diskus 250 mcg-50 mcg/dose powder for inhalation     levonorgestrel (MIRENA, 52 MG,) 20 MCG/24HR IUD Mirena 20 mcg/24 hours (6 yrs) 52 mg intrauterine device  Take 1 device by intrauterine route.     No current facility-administered medications on file prior  to visit.    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    Past Surgical History:  Procedure Laterality Date   CESAREAN SECTION     x2    Social History   Socioeconomic History   Marital status: Single    Spouse name: Not on file   Number of children: Not on file   Years of education: Not on file   Highest education level: Not on file  Occupational History   Not on file  Tobacco Use   Smoking status: Never   Smokeless tobacco: Never  Vaping Use   Vaping Use: Never used  Substance and Sexual Activity   Alcohol use: Yes    Alcohol/week: 1.0 standard  drink    Types: 1 Glasses of wine per week   Drug use: Never   Sexual activity: Not Currently    Birth control/protection: I.U.D.  Other Topics Concern   Not on file  Social History Narrative   Not on file   Social Determinants of Health   Financial Resource Strain: Not on file  Food Insecurity: Not on file  Transportation Needs: Not on file  Physical Activity: Not on file  Stress: Not on file  Social Connections: Not on file    Family History  Problem Relation Age of Onset   Depression Brother    Suicidality Brother        Review of Systems  Constitutional:  Negative for fever, malaise/fatigue and weight loss.  HENT:  Negative for congestion and sore throat.   Eyes:        Negative for visual changes  Respiratory:  Negative for cough and shortness of breath.   Cardiovascular:  Negative for chest pain, palpitations and leg swelling.  Gastrointestinal:  Negative for blood in stool, constipation, diarrhea and heartburn.  Genitourinary:  Negative for dysuria, frequency and urgency.  Musculoskeletal:  Negative for falls, joint pain and myalgias.  Skin:  Negative for rash.  Neurological:  Negative for dizziness, sensory change and headaches.  Endo/Heme/Allergies:  Does not bruise/bleed easily.  Psychiatric/Behavioral:  Negative for depression, hallucinations, substance abuse and suicidal ideas. The patient is not nervous/anxious and does not have insomnia.    Objective:   Vitals:   03/28/21 1022  BP: 118/76  Pulse: 90  Temp: (!) 97.5 F (36.4 C)  SpO2: 97%   Body mass index is 34.31 kg/m.  Physical Examination:  Physical Exam Constitutional:      General: She is not in acute distress.    Appearance: She is obese.  HENT:     Right Ear: Tympanic membrane, ear canal and external ear normal.     Left Ear: Tympanic membrane, ear canal and external ear normal.  Eyes:     General: No scleral icterus.    Extraocular Movements: Extraocular movements intact.      Conjunctiva/sclera: Conjunctivae normal.  Cardiovascular:     Rate and Rhythm: Normal rate and regular rhythm.     Pulses: Normal pulses.     Heart sounds: Normal heart sounds.  Pulmonary:     Effort: Pulmonary effort is normal. No respiratory distress.     Breath sounds: Normal breath sounds.  Abdominal:     General: Bowel sounds are normal. There is no distension.     Palpations: Abdomen is soft.     Tenderness: There is no abdominal tenderness.  Genitourinary:    Comments: Deferred breast and  pelvic exam to GYN per patient Musculoskeletal:        General: Normal range of motion.     Cervical back: Normal range of motion and neck supple.     Right lower leg: No edema.     Left lower leg: No edema.  Lymphadenopathy:     Cervical: No cervical adenopathy.  Skin:    General: Skin is warm and dry.  Neurological:     Mental Status: She is alert and oriented to person, place, and time.  Psychiatric:        Mood and Affect: Mood normal.        Behavior: Behavior normal.        Thought Content: Thought content normal.   ASSESSMENT and PLAN: This visit occurred during the SARS-CoV-2 public health emergency.  Safety protocols were in place, including screening questions prior to the visit, additional usage of staff PPE, and extensive cleaning of exam room while observing appropriate contact time as indicated for disinfecting solutions.   Anneke was seen today for establish care.  Diagnoses and all orders for this visit:  Encounter for preventative adult health care exam with abnormal findings -     Comprehensive metabolic panel -     CBC with Differential/Platelet -     Lipid panel -     TSH  Flu vaccine need -     Flu Vaccine QUAD 6+ mos PF IM (Fluarix Quad PF)  Need for diphtheria-tetanus-pertussis (Tdap) vaccine -     Tdap vaccine greater than or equal to 7yo IM  Class 1 obesity due to excess calories without serious comorbidity with body mass index (BMI) of 34.0 to 34.9  in adult  Encounter for lipid screening for cardiovascular disease -     Lipid panel      Problem List Items Addressed This Visit       Other   Class 1 obesity due to excess calories without serious comorbidity with body mass index (BMI) of 34.0 to 34.9 in adult    Advised about the need for regular exercise and heart health meal choices. Provided printed information.  Wt Readings from Last 3 Encounters:  03/28/21 187 lb 9.6 oz (85.1 kg)  09/01/18 196 lb 6.4 oz (89.1 kg)        Other Visit Diagnoses     Encounter for preventative adult health care exam with abnormal findings    -  Primary   Relevant Orders   Comprehensive metabolic panel   CBC with Differential/Platelet   Lipid panel   TSH   Flu vaccine need       Relevant Orders   Flu Vaccine QUAD 6+ mos PF IM (Fluarix Quad PF) (Completed)   Need for diphtheria-tetanus-pertussis (Tdap) vaccine       Relevant Orders   Tdap vaccine greater than or equal to 7yo IM (Completed)   Encounter for lipid screening for cardiovascular disease       Relevant Orders   Lipid panel       Follow up: Return in about 1 year (around 03/28/2022) for CPE (fasting).  Alysia Penna, NP

## 2021-03-28 NOTE — Assessment & Plan Note (Addendum)
Advised about the need for regular exercise and heart health meal choices. Provided printed information.  Wt Readings from Last 3 Encounters:  03/28/21 187 lb 9.6 oz (85.1 kg)  09/01/18 196 lb 6.4 oz (89.1 kg)

## 2021-03-30 ENCOUNTER — Encounter: Payer: Self-pay | Admitting: Nurse Practitioner

## 2021-10-17 DIAGNOSIS — Z01419 Encounter for gynecological examination (general) (routine) without abnormal findings: Secondary | ICD-10-CM | POA: Diagnosis not present

## 2021-10-17 DIAGNOSIS — Z1231 Encounter for screening mammogram for malignant neoplasm of breast: Secondary | ICD-10-CM | POA: Diagnosis not present

## 2021-10-17 DIAGNOSIS — Z6836 Body mass index (BMI) 36.0-36.9, adult: Secondary | ICD-10-CM | POA: Diagnosis not present

## 2021-10-27 ENCOUNTER — Ambulatory Visit (INDEPENDENT_AMBULATORY_CARE_PROVIDER_SITE_OTHER): Payer: Federal, State, Local not specified - PPO

## 2021-10-27 DIAGNOSIS — Z23 Encounter for immunization: Secondary | ICD-10-CM

## 2021-10-27 NOTE — Progress Notes (Signed)
After obtaining consent, and per orders of Dr. Alysia Penna, injection of Influenza given by [place]. Patient instructed to remain in clinic for 20 minutes afterwards, and to report any adverse reaction to me immediately.

## 2021-11-30 ENCOUNTER — Encounter: Payer: Self-pay | Admitting: Nurse Practitioner

## 2022-02-04 IMAGING — US US THYROID
1 series · 14 of 25 positions shown · non-contrast
Comparison: None.

CLINICAL DATA: Thyroid nodule.

EXAM:
THYROID ULTRASOUND
TECHNIQUE: Ultrasound examination of the thyroid gland and adjacent soft
tissues was performed.

[Series 1: us thyroid · 0.06mm/px · 14 of 54 slices shown]
[im 1/54]
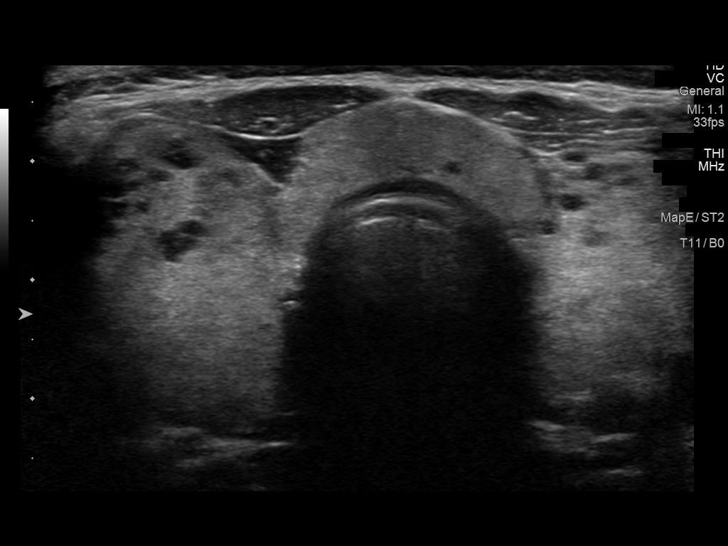
[im 5/54]
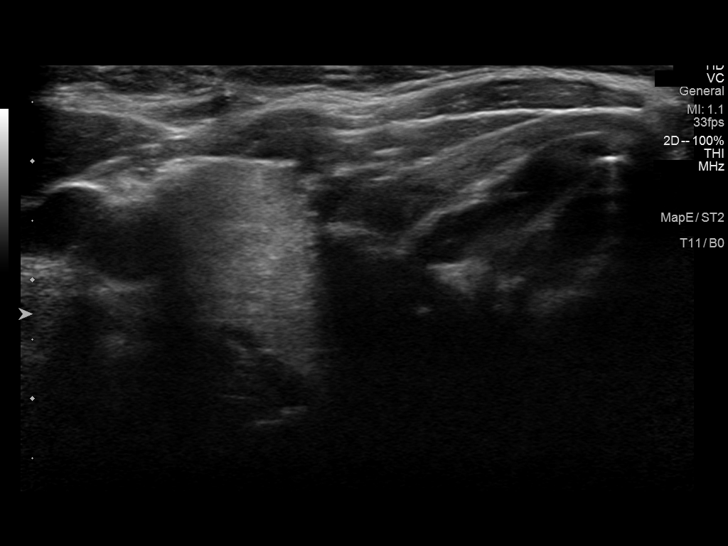
[im 9/54]
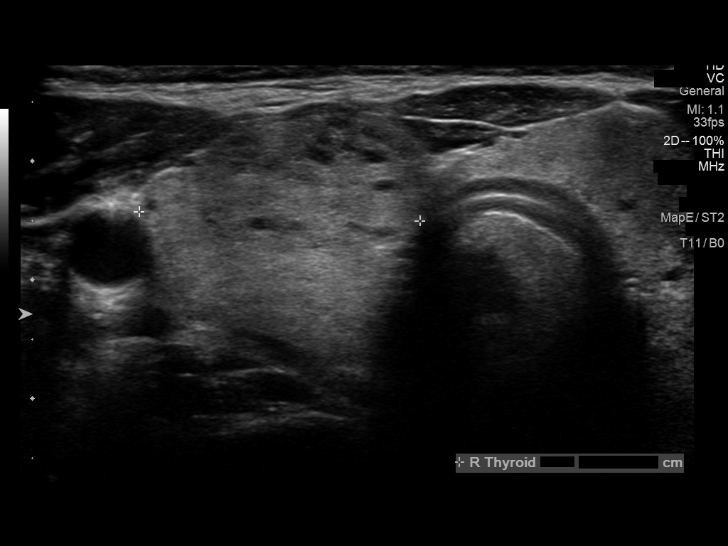
[im 14/54]
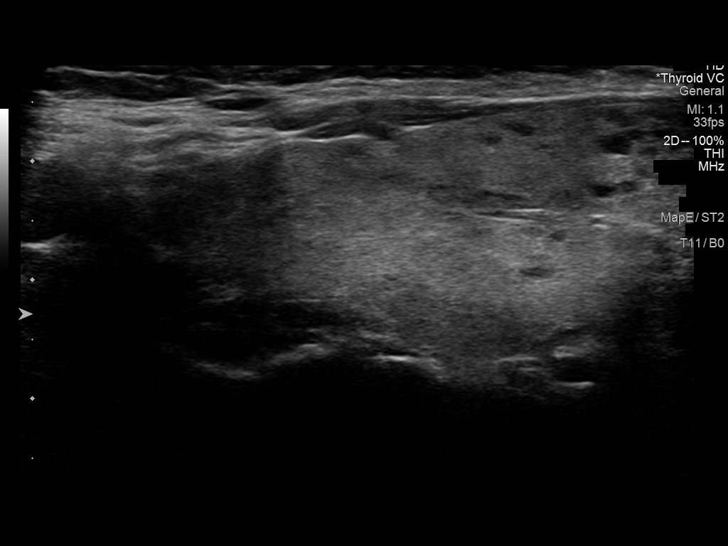
[im 18/54]
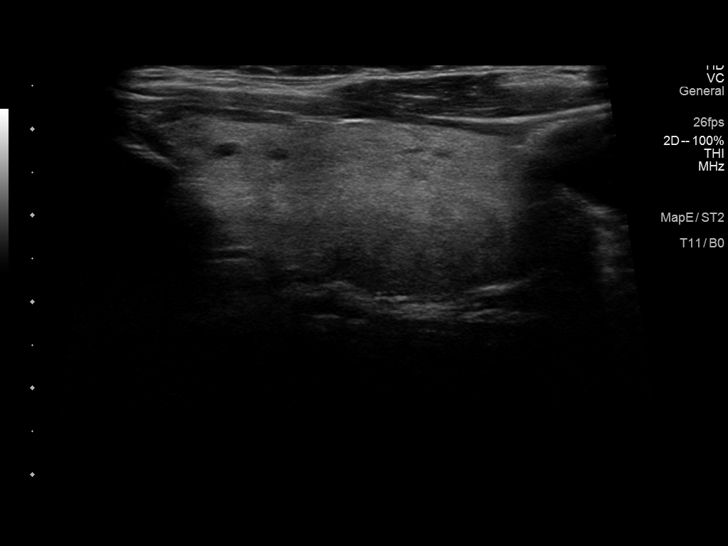
[im 20/54]
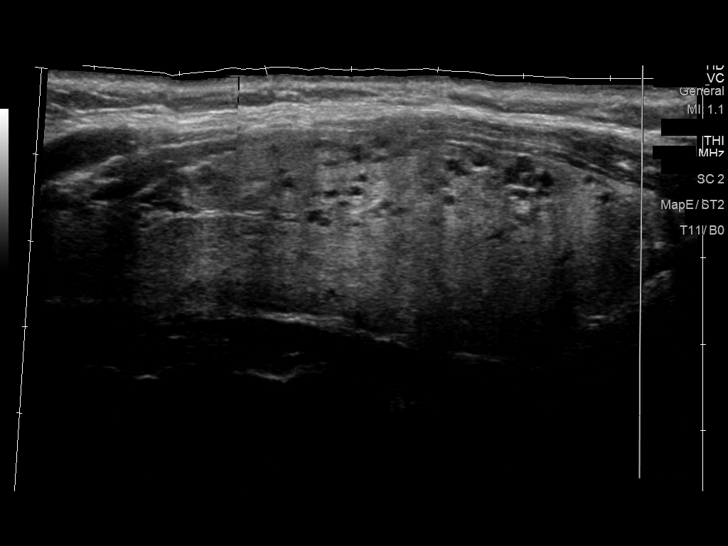
[im 25/54]
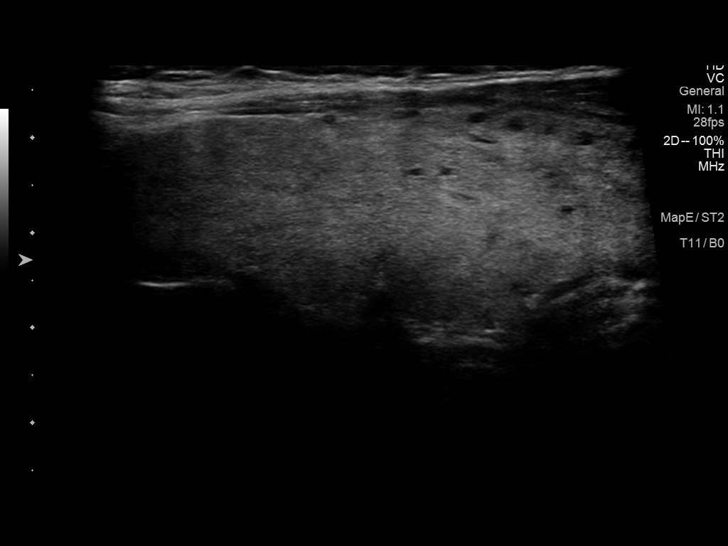
[im 29/54]
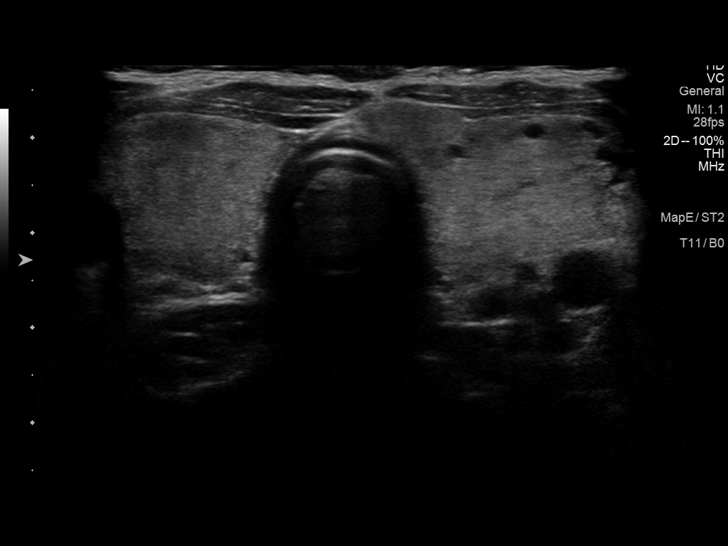
[im 34/54]
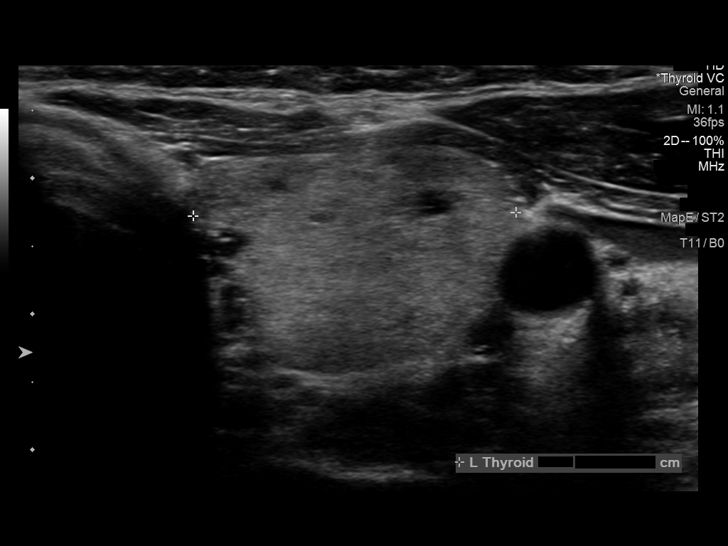
[im 36/54]
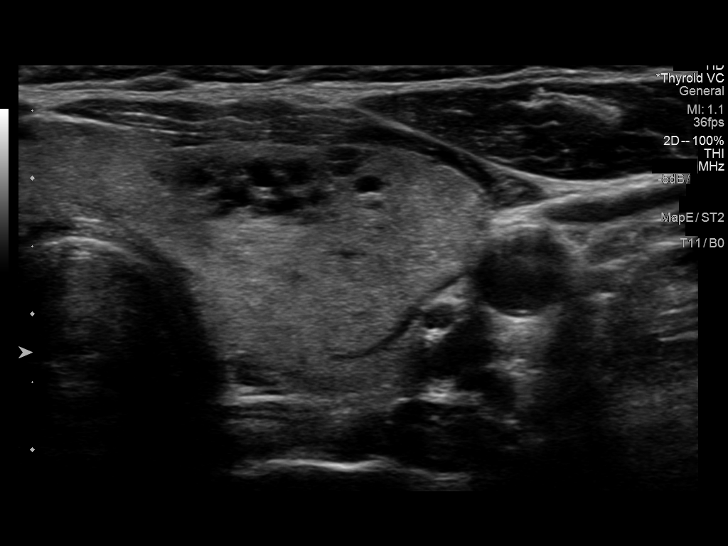
[im 40/54]
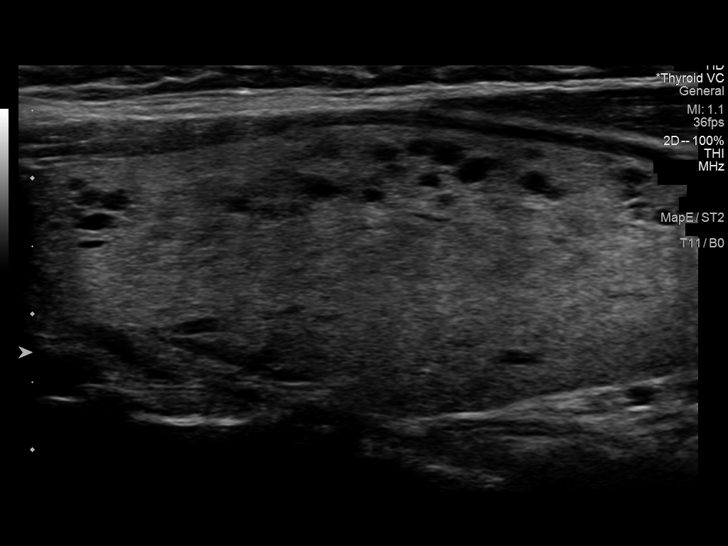
[im 45/54]
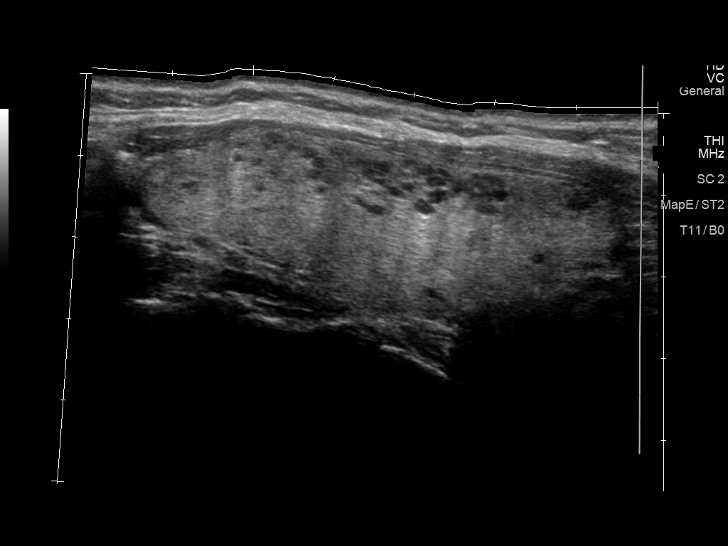
[im 49/54]
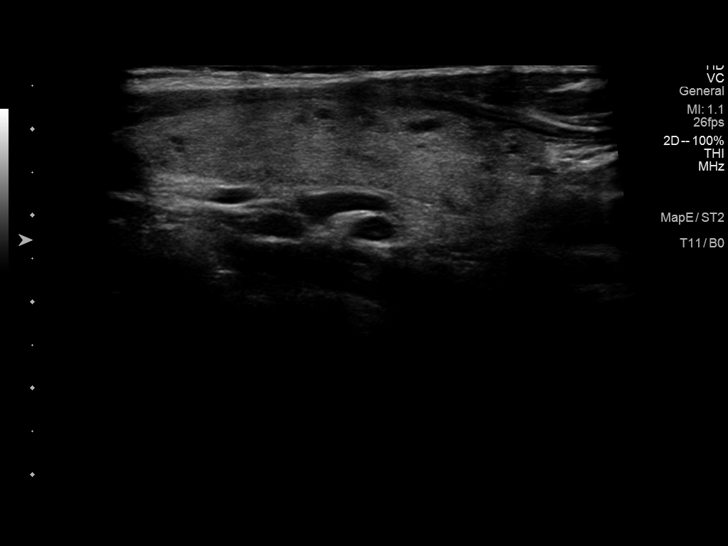
[im 54/54]
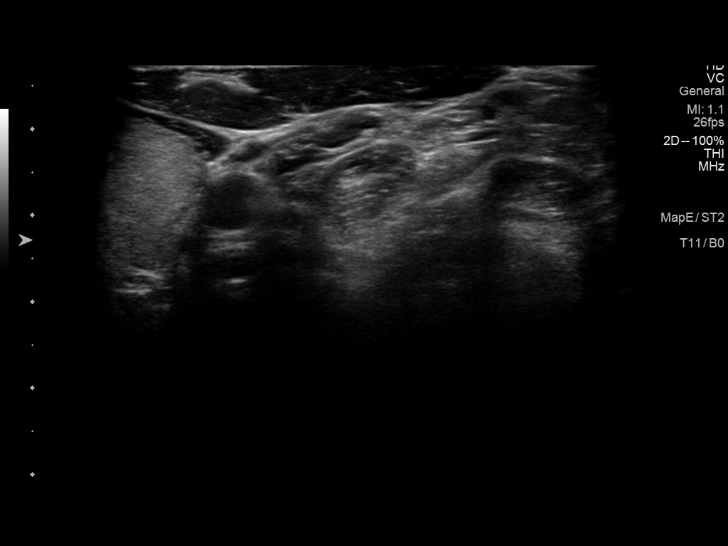

[14 of 25 positions shown; findings below may reference images not displayed]

FINDINGS: Parenchymal Echotexture: Moderately heterogenous

Isthmus: 0.7 cm

Right lobe: 7.4 x 2.4 x 2.4 cm

Left lobe: 6 x 2.1 x 2.4 cm

_________________________________________________________

Estimated total number of nodules >/= 1 cm: 0

Number of spongiform nodules >/=  2 cm not described below (TR1): 0

Number of mixed cystic and solid nodules >/= 1.5 cm not described
below (TR2): 0

_________________________________________________________

No worrisome thyroid nodule identified on today's study. Multiple
small cystic nodules are noted that do not meet criteria for
follow-up or FNA.
IMPRESSION: Moderately heterogeneous goiter without evidence for distinct
thyroid nodule meeting criteria for follow-up or FNA.

The above is in keeping with the ACR TI-RADS recommendations - [HOSPITAL] 1242;[DATE].

## 2022-03-30 ENCOUNTER — Encounter: Payer: Self-pay | Admitting: Nurse Practitioner

## 2022-03-30 ENCOUNTER — Ambulatory Visit (INDEPENDENT_AMBULATORY_CARE_PROVIDER_SITE_OTHER): Payer: Federal, State, Local not specified - PPO | Admitting: Nurse Practitioner

## 2022-03-30 VITALS — BP 112/60 | HR 85 | Temp 97.4°F | Ht 62.0 in | Wt 197.4 lb

## 2022-03-30 DIAGNOSIS — F4323 Adjustment disorder with mixed anxiety and depressed mood: Secondary | ICD-10-CM | POA: Diagnosis not present

## 2022-03-30 DIAGNOSIS — Z0001 Encounter for general adult medical examination with abnormal findings: Secondary | ICD-10-CM | POA: Diagnosis not present

## 2022-03-30 DIAGNOSIS — D649 Anemia, unspecified: Secondary | ICD-10-CM | POA: Insufficient documentation

## 2022-03-30 DIAGNOSIS — E7800 Pure hypercholesterolemia, unspecified: Secondary | ICD-10-CM

## 2022-03-30 LAB — CBC WITH DIFFERENTIAL/PLATELET
Basophils Absolute: 0 10*3/uL (ref 0.0–0.1)
Basophils Relative: 1.1 % (ref 0.0–3.0)
Eosinophils Absolute: 0 10*3/uL (ref 0.0–0.7)
Eosinophils Relative: 1 % (ref 0.0–5.0)
HCT: 38.7 % (ref 36.0–46.0)
Hemoglobin: 13 g/dL (ref 12.0–15.0)
Lymphocytes Relative: 43.2 % (ref 12.0–46.0)
Lymphs Abs: 2 10*3/uL (ref 0.7–4.0)
MCHC: 33.6 g/dL (ref 30.0–36.0)
MCV: 93.9 fl (ref 78.0–100.0)
Monocytes Absolute: 0.6 10*3/uL (ref 0.1–1.0)
Monocytes Relative: 13.9 % — ABNORMAL HIGH (ref 3.0–12.0)
Neutro Abs: 1.8 10*3/uL (ref 1.4–7.7)
Neutrophils Relative %: 40.8 % — ABNORMAL LOW (ref 43.0–77.0)
Platelets: 214 10*3/uL (ref 150.0–400.0)
RBC: 4.12 Mil/uL (ref 3.87–5.11)
RDW: 13.5 % (ref 11.5–15.5)
WBC: 4.5 10*3/uL (ref 4.0–10.5)

## 2022-03-30 LAB — COMPREHENSIVE METABOLIC PANEL
ALT: 25 U/L (ref 0–35)
AST: 18 U/L (ref 0–37)
Albumin: 4.5 g/dL (ref 3.5–5.2)
Alkaline Phosphatase: 57 U/L (ref 39–117)
BUN: 12 mg/dL (ref 6–23)
CO2: 27 mEq/L (ref 19–32)
Calcium: 9.2 mg/dL (ref 8.4–10.5)
Chloride: 104 mEq/L (ref 96–112)
Creatinine, Ser: 0.7 mg/dL (ref 0.40–1.20)
GFR: 104.98 mL/min (ref >60.00)
Glucose, Bld: 94 mg/dL (ref 70–99)
Potassium: 4.3 mEq/L (ref 3.5–5.1)
Sodium: 139 mEq/L (ref 135–145)
Total Bilirubin: 0.8 mg/dL (ref 0.2–1.2)
Total Protein: 7.4 g/dL (ref 6.0–8.3)

## 2022-03-30 LAB — LIPID PANEL
Cholesterol: 189 mg/dL (ref 0–200)
HDL: 59 mg/dL (ref >39.00)
LDL Cholesterol: 119 mg/dL — ABNORMAL HIGH (ref 0–99)
NonHDL: 129.71
Total CHOL/HDL Ratio: 3
Triglycerides: 52 mg/dL (ref 0.0–149.0)
VLDL: 10.4 mg/dL (ref 0.0–40.0)

## 2022-03-30 NOTE — Assessment & Plan Note (Signed)
Due to Stress at work and duties at home. Associated with racing thoughts, difficulty falling asleep-able to sleeps 6hrs at night, and feeling overwhelmed Support system: parents  Agreed to psychology referral

## 2022-03-30 NOTE — Progress Notes (Signed)
Complete physical exam  Patient: Rebecca Kim   DOB: [birth year]   47 y.o. Female  MRN: [id number] Visit Date: 03/30/2022  Subjective:    Chief Complaint  Patient presents with   Annual Exam    CPE Pt not fasting    Rebecca Kim is a 47 y.o. female who presents today for a complete physical exam. She reports consuming a general diet.  No exercise regimen  She generally feels poorly. She reports sleeping fairly well. She does have additional problems to discuss today.  Vision:Yes Dental:No STD Screen:No  BP Readings from Last 3 Encounters:  03/30/22 112/60  03/28/21 118/76  09/01/18 127/79   Wt Readings from Last 3 Encounters:  03/30/22 197 lb 6.4 oz (89.5 kg)  03/28/21 187 lb 9.6 oz (85.1 kg)  09/01/18 196 lb 6.4 oz (89.1 kg)   Most recent fall risk assessment:    03/28/2021   10:27 AM  Fall Risk   Falls in the past year? 0  Number falls in past yr: 0  Injury with Fall? 0  Risk for fall due to : No Fall Risks  Follow up Falls evaluation completed   Depression screen:Yes - Depression  Most recent depression screenings:    03/30/2022   11:55 AM 03/28/2021   10:34 AM  PHQ 2/9 Scores  PHQ - 2 Score 2 2  PHQ- 9 Score 14 8    HPI  Adjustment disorder with mixed anxiety and depressed mood Due to Stress at work and duties at home. 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   Past Surgical History:  Procedure Laterality Date   CESAREAN SECTION     x2   Social History   Socioeconomic History   Marital status: Single    Spouse name: Not on file   Number of children: Not on file   Years of education: Not on file   Highest education level: Not on file  Occupational History   Not on file  Tobacco Use    Smoking status: Never   Smokeless tobacco: Never  Vaping Use   Vaping Use: Never used  Substance and Sexual Activity   Alcohol use: Yes    Alcohol/week: 1.0 standard drink of alcohol    Types: 1 Glasses of wine per week   Drug use: Never   Sexual activity: Not Currently    Birth control/protection: I.U.D.  Other Topics Concern   Not on file  Social History Narrative   Not on file   Social Determinants of Health   Financial Resource Strain: Low Risk  (09/01/2018)   Overall Financial Resource Strain (CARDIA)    Difficulty of Paying Living Expenses: Not hard at all  Food Insecurity: No Food Insecurity (09/01/2018)   Hunger Vital Sign    Worried About Running Out of Food in the Last Year: Never true    [place] in the Last Year: Never true  Transportation Needs: No Transportation Needs (09/01/2018)   PRAPARE - Hydrologist (Medical): No    Lack of Transportation (Non-Medical): No  Physical Activity: Inactive (09/01/2018)   Exercise Vital Sign    Days of Exercise per Week: 0 days    Minutes of Exercise per  Session: 0 min  Stress: No Stress Concern Present (09/01/2018)   [employer] of Occupational Health - Occupational Stress Questionnaire    Feeling of Stress : Not at all  Social Connections: Moderately Isolated (09/01/2018)   Social Connection and Isolation Panel [NHANES]    Frequency of Communication with Friends and Family: Three times a week    Frequency of Social Gatherings with Friends and Family: Three times a week    Attends Religious Services: Never    Active Member of Clubs or Organizations: No    Attends Banker Meetings: Never    Marital Status: Married  Catering manager Violence: Not At Risk (09/01/2018)   Humiliation, Afraid, Rape, and Kick questionnaire    Fear of Current or Ex-Partner: No    Emotionally Abused: No    Physically Abused: No    Sexually Abused: No   Family Status  Relation Name Status    Mother  Alive   Father  Alive   Sister  Deceased   Brother  Alive   MGF  Deceased   PGM  Deceased   PGF  Deceased   Other  Deceased   Family History  Problem Relation Age of Onset   Depression Brother    Suicidality Brother    Allergies  Allergen Reactions   Pineapple Hives    Patient Care Team: Toddy Boyd, Bonna Gains, NP as PCP - General (Internal Medicine)   Medications: Outpatient Medications Prior to Visit  Medication Sig   albuterol (PROAIR HFA) 108 (90 Base) MCG/ACT inhaler Inhale 1-2 puffs into the lungs every 6 (six) hours as needed for wheezing or shortness of breath.   fluticasone (FLONASE) 50 MCG/ACT nasal spray fluticasone propionate 50 mcg/actuation nasal spray,suspension   Fluticasone-Salmeterol (ADVAIR) 250-50 MCG/DOSE AEPB Advair Diskus 250 mcg-50 mcg/dose powder for inhalation   levonorgestrel (MIRENA, 52 MG,) 20 MCG/24HR IUD Mirena 20 mcg/24 hours (6 yrs) 52 mg intrauterine device  Take 1 device by intrauterine route.   No facility-administered medications prior to visit.    Review of Systems  Constitutional:  Negative for fever.  HENT:  Negative for congestion and sore throat.   Eyes:        Negative for visual changes  Respiratory:  Negative for cough and shortness of breath.   Cardiovascular:  Negative for chest pain, palpitations and leg swelling.  Gastrointestinal:  Negative for blood in stool, constipation and diarrhea.  Genitourinary:  Negative for dysuria, frequency and urgency.  Musculoskeletal:  Negative for myalgias.  Skin: Negative.  Negative for rash.  Neurological:  Negative for dizziness and headaches.  Hematological:  Does not bruise/bleed easily.  Psychiatric/Behavioral:  Positive for dysphoric mood and sleep disturbance. Negative for confusion, decreased concentration, self-injury and suicidal ideas. The patient is nervous/anxious. The patient is not hyperactive.       Objective:  BP 112/60 (BP Location: Right Arm, Patient Position:  Sitting, Cuff Size: Normal)   Pulse 85   Temp (!) 97.4 F (36.3 C) (Temporal)   Ht 5\' 2"  (1.575 m)   Wt 197 lb 6.4 oz (89.5 kg)   SpO2 96%   BMI 36.10 kg/m     Physical Exam Constitutional:      General: She is not in acute distress.    Appearance: She is obese.  HENT:     Right Ear: Tympanic membrane, ear canal and external ear normal.     Left Ear: Tympanic membrane, ear canal and external ear normal.     Nose: Nose  normal.     Mouth/Throat:     Pharynx: No oropharyngeal exudate.  Eyes:     General: No scleral icterus.    Extraocular Movements: Extraocular movements intact.     Conjunctiva/sclera: Conjunctivae normal.     Pupils: Pupils are equal, round, and reactive to light.  Cardiovascular:     Rate and Rhythm: Normal rate and regular rhythm.     Pulses: Normal pulses.     Heart sounds: Normal heart sounds.  Pulmonary:     Effort: Pulmonary effort is normal. No respiratory distress.     Breath sounds: Normal breath sounds.  Chest:  Breasts:    Breasts are symmetrical.     Right: Normal.     Left: Normal.  Abdominal:     General: Bowel sounds are normal. There is no distension.     Palpations: Abdomen is soft.  Musculoskeletal:        General: Normal range of motion.     Cervical back: Normal range of motion and neck supple.     Right lower leg: No edema.     Left lower leg: No edema.  Lymphadenopathy:     Cervical: No cervical adenopathy.     Upper Body:     Right upper body: No supraclavicular, axillary or pectoral adenopathy.     Left upper body: No supraclavicular, axillary or pectoral adenopathy.  Skin:    General: Skin is warm and dry.  Neurological:     Mental Status: She is alert and oriented to person, place, and time.  Psychiatric:        Mood and Affect: Mood normal.        Behavior: Behavior normal.        Thought Content: Thought content normal.     No results found for any visits on 03/30/22.    Assessment & Plan:    Routine Health  Maintenance and Physical Exam  Immunization History  Administered Date(s) Administered   Influenza,inj,Quad PF,6+ Mos 01/22/2019, 03/28/2021, 10/27/2021   Moderna Sars-Covid-2 Vaccination 05/13/2019, 06/10/2019, 12/29/2019, 02/16/2021   Tdap 03/28/2021   Health Maintenance  Topic Date Due   COVID-19 Vaccine (5 - 2023-24 season) 04/15/2022 (Originally 11/03/2021)   Hepatitis C Screening  03/31/2023 (Originally 07/18/1995)   HIV Screening  03/31/2023 (Originally 07/17/1992)   PAP SMEAR-Modifier  08/05/2022   DTaP/Tdap/Td (2 - Td or Tdap) 03/29/2031   INFLUENZA VACCINE  Completed   HPV VACCINES  Aged Out   Discussed health benefits of physical activity, and encouraged her to engage in regular exercise appropriate for her age and condition.  Problem List Items Addressed This Visit       Other   Adjustment disorder with mixed anxiety and depressed mood    Due to Stress at work and duties at home. Associated with racing thoughts, difficulty falling asleep-able to sleeps 6hrs at night, and feeling overwhelmed Support system: parents  Agreed to psychology referral      Relevant Orders   Ambulatory referral to Psychology   Anemia   Relevant Orders   CBC with Differential/Platelet   Elevated LDL cholesterol level   Relevant Orders   Lipid panel   Other Visit Diagnoses     Encounter for preventative adult health care exam with abnormal findings    -  Primary   Relevant Orders   Comprehensive metabolic panel      Return in about 1 year (around 03/31/2023) for CPE (fasting).     Wilfred Lacy, NP

## 2022-03-30 NOTE — Patient Instructions (Addendum)
Go to lab Start heart healthy diet and daily exercise  You will contacted to schedule appt with psychology  Managing Stress, Adult Feeling a certain amount of stress is normal. Stress helps our body and mind get ready to deal with the demands of life. Stress hormones can motivate you to do well at work and meet your responsibilities. But severe or long-term (chronic) stress can affect your mental and physical health. Chronic stress puts you at higher risk for: Anxiety and depression. Other health problems such as digestive problems, muscle aches, heart disease, high blood pressure, and stroke. What are the causes? Common causes of stress include: Demands from work, such as deadlines, feeling overworked, or having long hours. Pressures at home, such as money issues, disagreements with a spouse, or parenting issues. Pressures from major life changes, such as divorce, moving, loss of a loved one, or chronic illness. You may be at higher risk for stress-related problems if you: Do not get enough sleep. Are in poor health. Do not have emotional support. Have a mental health disorder such as anxiety or depression. How to recognize stress Stress can make you: Have trouble sleeping. Feel sad, anxious, irritable, or overwhelmed. Lose your appetite. Overeat or want to eat unhealthy foods. Want to use drugs or alcohol. Stress can also cause physical symptoms, such as: Sore, tense muscles, especially in the shoulders and neck. Headaches. Trouble breathing. A faster heart rate. Stomach pain, nausea, or vomiting. Diarrhea or constipation. Trouble concentrating. Follow these instructions at home: Eating and drinking Eat a healthy diet. This includes: Eating foods that are high in fiber, such as beans, whole grains, and fresh fruits and vegetables. Limiting foods that are high in fat and processed sugars, such as fried or sweet foods. Do not skip meals or overeat. Drink enough fluid to keep  your urine pale yellow. Alcohol use Do not drink alcohol if: Your health care provider tells you not to drink. You are pregnant, may be pregnant, or are planning to become pregnant. Drinking alcohol is a way some people try to ease their stress. This can be dangerous, so if you drink alcohol: Limit how much you have to: 0-1 drink a day for women. 0-2 drinks a day for men. Know how much alcohol is in your drink. In the [place] one drink equals one 12 oz bottle of beer (355 mL), one 5 oz glass of wine (148 mL), or one 1 oz glass of hard liquor (44 mL). Activity  Include 30 minutes of exercise in your daily schedule. Exercise is a good stress reducer. Include time in your day for an activity that you find relaxing. Try taking a walk, going on a bike ride, reading a book, or listening to music. Schedule your time in a way that lowers stress, and keep a regular schedule. Focus on doing what is most important to get done. Lifestyle Identify the source of your stress and your reaction to it. See a therapist who can help you change unhelpful reactions. When there are stressful events: Talk about them with family, friends, or coworkers. Try to think realistically about stressful events and not ignore them or overreact. Try to find the positives in a stressful situation and not focus on the negatives. Cut back on responsibilities at work and home, if possible. Ask for help from friends or family members if you need it. Find ways to manage stress, such as: Mindfulness, meditation, or deep breathing. Yoga or tai chi. Progressive muscle relaxation. Spending time in  nature. Doing art, playing music, or reading. Making time for fun activities. Spending time with family and friends. Get support from family, friends, or spiritual resources. General instructions Get enough sleep. Try to go to sleep and get up at about the same time every day. Take over-the-counter and prescription medicines only as  told by your health care provider. Do not use any products that contain nicotine or tobacco. These products include cigarettes, chewing tobacco, and vaping devices, such as e-cigarettes. If you need help quitting, ask your health care provider. Do not use drugs or smoke to deal with stress. Keep all follow-up visits. This is important. Where to find support Talk with your health care provider about stress management or finding a support group. Find a therapist to work with you on your stress management techniques. Where to find more information [employer] on Mental Illness: www.nami.org American Psychological Association: TVStereos.ch Contact a health care provider if: Your stress symptoms get worse. You are unable to manage your stress at home. You are struggling to stop using drugs or alcohol. Get help right away if: You may be a danger to yourself or others. You have any thoughts of death or suicide. Get help right awayif you feel like you may hurt yourself or others, or have thoughts about taking your own life. Go to your nearest emergency room or: Call 911. Call the [facility] at 308-550-9714 or 988 in the [place] This is open 24 hours a day. Text the Crisis Text Line at (914)119-4890. Summary Feeling a certain amount of stress is normal, but severe or long-term (chronic) stress can affect your mental and physical health. Chronic stress can put you at higher risk for anxiety, depression, and other health problems such as digestive problems, muscle aches, heart disease, high blood pressure, and stroke. You may be at higher risk for stress-related problems if you do not get enough sleep, are in poor health, lack emotional support, or have a mental health disorder such as anxiety or depression. Identify the source of your stress and your reaction to it. Try talking about stressful events with family, friends, or coworkers, finding a coping method, or getting  support from spiritual resources. If you need more help, talk with your health care provider about finding a support group or a mental health therapist. This information is not intended to replace advice given to you by your health care provider. Make sure you discuss any questions you have with your health care provider. Document Revised: 09/15/2020 Document Reviewed: 09/13/2020 Elsevier Patient Education  [place].

## 2022-04-28 ENCOUNTER — Encounter (HOSPITAL_COMMUNITY): Payer: Self-pay

## 2022-04-28 ENCOUNTER — Ambulatory Visit (HOSPITAL_COMMUNITY)
Admission: EM | Admit: 2022-04-28 | Discharge: 2022-04-28 | Disposition: A | Discharge status: Home / Self Care | Payer: Federal, State, Local not specified - PPO | Attending: Physician Assistant | Admitting: Physician Assistant

## 2022-04-28 DIAGNOSIS — U071 COVID-19: Secondary | ICD-10-CM

## 2022-04-28 DIAGNOSIS — J4521 Mild intermittent asthma with (acute) exacerbation: Secondary | ICD-10-CM | POA: Diagnosis not present

## 2022-04-28 DIAGNOSIS — Z8709 Personal history of other diseases of the respiratory system: Secondary | ICD-10-CM

## 2022-04-28 MED ORDER — ALBUTEROL SULFATE HFA 108 (90 BASE) MCG/ACT IN AERS: 1.0000 | INHALATION_SPRAY | Freq: Four times a day (QID) | RESPIRATORY_TRACT | 1 refills | Status: AC | PRN

## 2022-04-28 MED ORDER — PREDNISONE 10 MG (21) PO TBPK: ORAL_TABLET | ORAL | 0 refills | Status: AC

## 2022-04-28 NOTE — ED Triage Notes (Signed)
Pt tested positive for Covid today needs confirmation, pt is concerned about cough and SOB . Pt needs refill on inhaler

## 2022-04-28 NOTE — Discharge Instructions (Signed)
Usual your albuterol inhaler every 4-6 hours as needed.  Start prednisone tomorrow (04/29/2022).  Do not take NSAIDs with this medication including aspirin, ibuprofen/Advil, naproxen/Aleve.  You can use acetaminophen/Tylenol, Mucinex for symptom relief.  Make sure you rest and drink plenty of fluid.  If your symptoms or not improving within a week return for reevaluation.  If anything worsens you should be seen immediately including chest pain, shortness of breath, having to use your albuterol inhaler regularly without improvement of symptoms, weakness, nausea/vomiting interfering with oral intake.

## 2022-04-28 NOTE — ED Provider Notes (Signed)
[place]    CSN: [id number] Arrival date & time: 04/28/22  1612      History   Chief Complaint Chief Complaint  Patient presents with   Cough    Tested positive for Covid - Phone nurse said I need to be seen. - Entered by patient   Covid Positive    HPI Rebecca Kim is a 47 y.o. female.   Patient presents today with 1 day history of URI symptoms including cough, congestion, chest tightness.  She took an at-home COVID test that was positive.  She has had COVID in the past with last episode several years ago.  She contacted her primary care who recommended she be evaluated due to her history of asthma and she was experiencing some chest tightness.  She has not been taking her albuterol inhaler as she ran out of this medication.  She has had COVID-19 vaccinations including booster 1 month ago.  She denies any recent antibiotics or steroids.  She has not been taking any over-the-counter medication for symptom management.  She has no concern for pregnancy.  Reports that overall she is feeling well but is concerned that her asthma is getting flared given her symptoms.    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    Patient Active Problem List   Diagnosis Date Noted   Adjustment disorder with mixed anxiety and depressed mood 03/30/2022   Anemia 03/30/2022   Elevated LDL cholesterol level 03/30/2022   IUD (intrauterine device) in place 03/28/2021   Class 1 obesity due to excess calories without serious comorbidity with body mass index (BMI) of 34.0 to 34.9 in adult 03/28/2021   Adopted 03/28/2021   Asthma 12/21/2015    Past Surgical History:  Procedure Laterality Date   CESAREAN SECTION     x2    OB History   No obstetric history on file.      Home Medications    Prior to Admission medications   Medication Sig Start Date End Date Taking? Authorizing Provider  levonorgestrel  (MIRENA, 52 MG,) 20 MCG/24HR IUD Mirena 20 mcg/24 hours (6 yrs) 52 mg intrauterine device  Take 1 device by intrauterine route.   Yes [provider]  predniSONE (STERAPRED UNI-PAK 21 TAB) 10 MG (21) TBPK tablet As directed 04/28/22  Yes Kingsley Farace K, PA-C  albuterol (PROAIR HFA) 108 (90 Base) MCG/ACT inhaler Inhale 1-2 puffs into the lungs every 6 (six) hours as needed for wheezing or shortness of breath. 04/28/22   Jonothan Heberle, Derry Skill, PA-C  fluticasone (FLONASE) 50 MCG/ACT nasal spray fluticasone propionate 50 mcg/actuation nasal spray,suspension    [provider]  Fluticasone-Salmeterol (ADVAIR) 250-50 MCG/DOSE AEPB Advair Diskus 250 mcg-50 mcg/dose powder for inhalation    [provider]    Family History Family History  Problem Relation Age of Onset   Depression Brother    Suicidality Brother     Social History Social History   Tobacco Use   Smoking status: Never   Smokeless tobacco: Never  Vaping Use   Vaping Use: Never used  Substance Use Topics   Alcohol use: Yes    Alcohol/week: 1.0 standard drink of alcohol    Types: 1 Glasses of wine per week   Drug use: Never     Allergies   Pineapple   Review of Systems Review of Systems  Constitutional:  Positive for activity  change. Negative for appetite change, fatigue and fever.  HENT:  Positive for congestion. Negative for sinus pressure, sneezing and sore throat.   Respiratory:  Positive for cough and chest tightness. Negative for shortness of breath and wheezing.   Cardiovascular:  Negative for chest pain.  Gastrointestinal:  Negative for abdominal pain, diarrhea, nausea and vomiting.  Neurological:  Negative for dizziness, light-headedness and headaches.     Physical Exam Triage Vital Signs ED Triage Vitals  Enc Vitals Group     BP 04/28/22 1738 134/85     Pulse Rate 04/28/22 1738 98     Resp 04/28/22 1738 18     Temp 04/28/22 1738 98.9 F (37.2 C)     Temp Source 04/28/22 1738 Oral      SpO2 04/28/22 1738 95 %     Weight --      Height --      Head Circumference --      Peak Flow --      Pain Score 04/28/22 1739 0     Pain Loc --      Pain Edu? --      Excl. in [place]? --    No data found.  Updated Vital Signs BP 134/85 (BP Location: Left Arm)   Pulse 98   Temp 98.9 F (37.2 C) (Oral)   Resp 18   LMP  (LMP Unknown)   SpO2 95%   Visual Acuity Right Eye Distance:   Left Eye Distance:   Bilateral Distance:    Right Eye Near:   Left Eye Near:    Bilateral Near:     Physical Exam Vitals reviewed.  Constitutional:      General: She is awake. She is not in acute distress.    Appearance: Normal appearance. She is well-developed. She is not ill-appearing.     Comments: Very pleasant female appears stated age in no acute distress sitting comfortably in exam room  HENT:     Head: Normocephalic and atraumatic.     Right Ear: Tympanic membrane, ear canal and external ear normal. Tympanic membrane is not erythematous or bulging.     Left Ear: Tympanic membrane, ear canal and external ear normal. Tympanic membrane is not erythematous or bulging.     Nose:     Right Sinus: No maxillary sinus tenderness or frontal sinus tenderness.     Left Sinus: No maxillary sinus tenderness or frontal sinus tenderness.     Mouth/Throat:     Pharynx: Uvula midline. No oropharyngeal exudate or posterior oropharyngeal erythema.  Cardiovascular:     Rate and Rhythm: Normal rate and regular rhythm.     Heart sounds: Normal heart sounds, S1 normal and S2 normal. No murmur heard. Pulmonary:     Effort: Pulmonary effort is normal.     Breath sounds: Wheezing present. No rhonchi or rales.     Comments: Scattered wheezing.  Reactive cough with deep breathing Psychiatric:        Behavior: Behavior is cooperative.      UC Treatments / Results  Labs (all labs ordered are listed, but only abnormal results are displayed) Labs Reviewed - No data to display  EKG   Radiology No  results found.  Procedures Procedures (including critical care time)  Medications Ordered in UC Medications - No data to display  Initial Impression / Assessment and Plan / UC Course  I have reviewed the triage vital signs and the nursing notes.  Pertinent labs & imaging results that  were available during my care of the patient were reviewed by me and considered in my medical decision making (see chart for details).     Patient is well-appearing, afebrile, nontoxic, nontachycardic.  She tested positive for COVID at home so additional COVID testing was not performed.  We discussed that given her history of asthma she is a candidate for antiviral therapy including Paxlovid, however, ultimately she is not interested in starting these medications.  She is only on day 1 of therapy and so would be a candidate if she changes her mind.  She does have a recent EGFR of 104.98 mL/min from 03/30/2022.  She does not require any medication adjustment for Paxlovid.  She was provided a refill of albuterol and will start prednisone taper to help manage her symptoms including chest tightness are likely related to her asthma.  Discussed that she is not to take NSAIDs with this medication due to risk of GI bleeding.  Can use over-the-counter medication including Tylenol, Mucinex, Flonase.  Recommended that she rest and drink plenty of fluid.  If her symptoms or not improving within a week she is to return for reevaluation.  If she has any worsening symptoms she needs to be seen immediately.  She was provided a work excuse note with current CDC return to work guidelines based on positive COVID test result.  Final Clinical Impressions(s) / UC Diagnoses   Final diagnoses:  COVID-19  Mild intermittent asthma with acute exacerbation     Discharge Instructions      Usual your albuterol inhaler every 4-6 hours as needed.  Start prednisone tomorrow (04/29/2022).  Do not take NSAIDs with this medication including  aspirin, ibuprofen/Advil, naproxen/Aleve.  You can use acetaminophen/Tylenol, Mucinex for symptom relief.  Make sure you rest and drink plenty of fluid.  If your symptoms or not improving within a week return for reevaluation.  If anything worsens you should be seen immediately including chest pain, shortness of breath, having to use your albuterol inhaler regularly without improvement of symptoms, weakness, nausea/vomiting interfering with oral intake.     ED Prescriptions     Medication Sig Dispense Auth. Provider   albuterol (PROAIR HFA) 108 (90 Base) MCG/ACT inhaler Inhale 1-2 puffs into the lungs every 6 (six) hours as needed for wheezing or shortness of breath. 18 g Harry Bark K, PA-C   predniSONE (STERAPRED UNI-PAK 21 TAB) 10 MG (21) TBPK tablet As directed 21 tablet Torri Michalski K, PA-C      PDMP not reviewed this encounter.   Terrilee Croak, PA-C 04/28/22 1813    Lynnea Vandervoort, Derry Skill, PA-C 05/03/22 1701

## 2022-04-29 ENCOUNTER — Ambulatory Visit (HOSPITAL_COMMUNITY): Payer: Self-pay

## 2022-10-31 DIAGNOSIS — Z124 Encounter for screening for malignant neoplasm of cervix: Secondary | ICD-10-CM | POA: Diagnosis not present

## 2022-10-31 DIAGNOSIS — Z6838 Body mass index (BMI) 38.0-38.9, adult: Secondary | ICD-10-CM | POA: Diagnosis not present

## 2022-10-31 DIAGNOSIS — Z01419 Encounter for gynecological examination (general) (routine) without abnormal findings: Secondary | ICD-10-CM | POA: Diagnosis not present

## 2022-10-31 DIAGNOSIS — Z1231 Encounter for screening mammogram for malignant neoplasm of breast: Secondary | ICD-10-CM | POA: Diagnosis not present

## 2022-10-31 LAB — HM PAP SMEAR

## 2022-10-31 LAB — HM MAMMOGRAPHY

## 2022-11-02 ENCOUNTER — Encounter: Payer: Self-pay | Admitting: Nurse Practitioner

## 2022-11-06 ENCOUNTER — Encounter: Payer: Self-pay | Admitting: Nurse Practitioner

## 2022-11-07 DIAGNOSIS — R051 Acute cough: Secondary | ICD-10-CM | POA: Diagnosis not present

## 2022-11-07 DIAGNOSIS — R0981 Nasal congestion: Secondary | ICD-10-CM | POA: Diagnosis not present

## 2023-04-02 ENCOUNTER — Encounter: Payer: Self-pay | Admitting: Nurse Practitioner

## 2023-04-02 ENCOUNTER — Ambulatory Visit (INDEPENDENT_AMBULATORY_CARE_PROVIDER_SITE_OTHER): Payer: Federal, State, Local not specified - PPO | Admitting: Nurse Practitioner

## 2023-04-02 VITALS — BP 117/80 | HR 82 | Temp 98.5°F | Resp 18 | Ht 62.0 in | Wt 199.6 lb

## 2023-04-02 DIAGNOSIS — Z23 Encounter for immunization: Secondary | ICD-10-CM | POA: Diagnosis not present

## 2023-04-02 DIAGNOSIS — Z0000 Encounter for general adult medical examination without abnormal findings: Secondary | ICD-10-CM

## 2023-04-02 DIAGNOSIS — Z1211 Encounter for screening for malignant neoplasm of colon: Secondary | ICD-10-CM

## 2023-04-02 DIAGNOSIS — E7800 Pure hypercholesterolemia, unspecified: Secondary | ICD-10-CM

## 2023-04-02 DIAGNOSIS — Z0001 Encounter for general adult medical examination with abnormal findings: Secondary | ICD-10-CM

## 2023-04-02 LAB — COMPREHENSIVE METABOLIC PANEL
ALT: 14 U/L (ref 0–35)
AST: 14 U/L (ref 0–37)
Albumin: 4.5 g/dL (ref 3.5–5.2)
Alkaline Phosphatase: 53 U/L (ref 39–117)
BUN: 8 mg/dL (ref 6–23)
CO2: 26 meq/L (ref 19–32)
Calcium: 9.1 mg/dL (ref 8.4–10.5)
Chloride: 105 meq/L (ref 96–112)
Creatinine, Ser: 0.61 mg/dL (ref 0.40–1.20)
GFR: 107.75 mL/min (ref >60.00)
Glucose, Bld: 99 mg/dL (ref 70–99)
Potassium: 4.2 meq/L (ref 3.5–5.1)
Sodium: 138 meq/L (ref 135–145)
Total Bilirubin: 0.8 mg/dL (ref 0.2–1.2)
Total Protein: 7.3 g/dL (ref 6.0–8.3)

## 2023-04-02 LAB — TSH: TSH: 2.42 u[IU]/mL (ref 0.35–5.50)

## 2023-04-02 LAB — LIPID PANEL
Cholesterol: 198 mg/dL (ref 0–200)
HDL: 58.5 mg/dL (ref >39.00)
LDL Cholesterol: 130 mg/dL — ABNORMAL HIGH (ref 0–99)
NonHDL: 139.06
Total CHOL/HDL Ratio: 3
Triglycerides: 47 mg/dL (ref 0.0–149.0)
VLDL: 9.4 mg/dL (ref 0.0–40.0)

## 2023-04-02 NOTE — Patient Instructions (Signed)
Go to lab Maintain Heart healthy diet and daily exercise. Maintain current medications.  Preventive Care 64-46 Years Old, Female Preventive care refers to lifestyle choices and visits with your health care provider that can promote health and wellness. Preventive care visits are also called wellness exams. What can I expect for my preventive care visit? Counseling Your health care provider may ask you questions about your: Medical history, including: Past medical problems. Family medical history. Pregnancy history. Current health, including: Menstrual cycle. Method of birth control. Emotional well-being. Home life and relationship well-being. Sexual activity and sexual health. Lifestyle, including: Alcohol, nicotine or tobacco, and drug use. Access to firearms. Diet, exercise, and sleep habits. Work and work Astronomer. Sunscreen use. Safety issues such as seatbelt and bike helmet use. Physical exam Your health care provider will check your: Height and weight. These may be used to calculate your BMI (body mass index). BMI is a measurement that tells if you are at a healthy weight. Waist circumference. This measures the distance around your waistline. This measurement also tells if you are at a healthy weight and may help predict your risk of certain diseases, such as type 2 diabetes and high blood pressure. Heart rate and blood pressure. Body temperature. Skin for abnormal spots. What immunizations do I need?  Vaccines are usually given at various ages, according to a schedule. Your health care provider will recommend vaccines for you based on your age, medical history, and lifestyle or other factors, such as travel or where you work. What tests do I need? Screening Your health care provider may recommend screening tests for certain conditions. This may include: Lipid and cholesterol levels. Diabetes screening. This is done by checking your blood sugar (glucose) after you  have not eaten for a while (fasting). Pelvic exam and Pap test. Hepatitis B test. Hepatitis C test. HIV (human immunodeficiency virus) test. STI (sexually transmitted infection) testing, if you are at risk. Lung cancer screening. Colorectal cancer screening. Mammogram. Talk with your health care provider about when you should start having regular mammograms. This may depend on whether you have a family history of breast cancer. BRCA-related cancer screening. This may be done if you have a family history of breast, ovarian, tubal, or peritoneal cancers. Bone density scan. This is done to screen for osteoporosis. Talk with your health care provider about your test results, treatment options, and if necessary, the need for more tests. Follow these instructions at home: Eating and drinking  Eat a diet that includes fresh fruits and vegetables, whole grains, lean protein, and low-fat dairy products. Take vitamin and mineral supplements as recommended by your health care provider. Do not drink alcohol if: Your health care provider tells you not to drink. You are pregnant, may be pregnant, or are planning to become pregnant. If you drink alcohol: Limit how much you have to 0-1 drink a day. Know how much alcohol is in your drink. In the [place] one drink equals one 12 oz bottle of beer (355 mL), one 5 oz glass of wine (148 mL), or one 1 oz glass of hard liquor (44 mL). Lifestyle Brush your teeth every morning and night with fluoride toothpaste. Floss one time each day. Exercise for at least 30 minutes 5 or more days each week. Do not use any products that contain nicotine or tobacco. These products include cigarettes, chewing tobacco, and vaping devices, such as e-cigarettes. If you need help quitting, ask your health care provider. Do not use drugs. If you are  sexually active, practice safe sex. Use a condom or other form of protection to prevent STIs. If you do not wish to become pregnant, use  a form of birth control. If you plan to become pregnant, see your health care provider for a prepregnancy visit. Take aspirin only as told by your health care provider. Make sure that you understand how much to take and what form to take. Work with your health care provider to find out whether it is safe and beneficial for you to take aspirin daily. Find healthy ways to manage stress, such as: Meditation, yoga, or listening to music. Journaling. Talking to a trusted person. Spending time with friends and family. Minimize exposure to UV radiation to reduce your risk of skin cancer. Safety Always wear your seat belt while driving or riding in a vehicle. Do not drive: If you have been drinking alcohol. Do not ride with someone who has been drinking. When you are tired or distracted. While texting. If you have been using any mind-altering substances or drugs. Wear a helmet and other protective equipment during sports activities. If you have firearms in your house, make sure you follow all gun safety procedures. Seek help if you have been physically or sexually abused. What's next? Visit your health care provider once a year for an annual wellness visit. Ask your health care provider how often you should have your eyes and teeth checked. Stay up to date on all vaccines. This information is not intended to replace advice given to you by your health care provider. Make sure you discuss any questions you have with your health care provider. Document Revised: 08/17/2020 Document Reviewed: 08/17/2020 Elsevier Patient Education  2024 [employer].

## 2023-04-02 NOTE — Progress Notes (Signed)
Complete physical exam  Patient: Rebecca Kim   DOB: 1975-09-03   47 y.o. Female  MRN: [id number] Visit Date: 04/02/2023  Subjective:    Chief Complaint  Patient presents with   Annual Exam    Prevnar 20, flu vaccine and cologuard is due    Rebecca Kim is a 48 y.o. female who presents today for a complete physical exam. She reports consuming a general diet.  Walking daily  She generally feels fairly well. She reports sleeping fairly well. She does not have additional problems to discuss today.  Vision:Yes Dental:Yes STD Screen:No  Last mammogram 10/2022-report requested.  BP Readings from Last 3 Encounters:  04/02/23 117/80  04/28/22 134/85  03/30/22 112/60   Wt Readings from Last 3 Encounters:  04/02/23 199 lb 9.6 oz (90.5 kg)  03/30/22 197 lb 6.4 oz (89.5 kg)  03/28/21 187 lb 9.6 oz (85.1 kg)    Most recent fall risk assessment:    03/28/2021   10:27 AM  Fall Risk   Falls in the past year? 0  Number falls in past yr: 0  Injury with Fall? 0  Risk for fall due to : No Fall Risks  Follow up Falls evaluation completed     Depression screen:Yes - No Depression Most recent depression screenings:    04/02/2023    8:56 AM 03/30/2022   11:55 AM  PHQ 2/9 Scores  PHQ - 2 Score 2 2  PHQ- 9 Score 7 14   HPI  No problem-specific Assessment & Plan notes found for this encounter.  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   Past Surgical History:  Procedure Laterality Date   CESAREAN SECTION     x2   Social History   Socioeconomic History   Marital status: Single    Spouse name: Not on file   Number of children: Not on file   Years of education: Not on file   Highest education level: Not on file  Occupational History   Not on file  Tobacco Use   Smoking status: Never   Smokeless tobacco: Never  Vaping Use   Vaping status: Never Used  Substance and Sexual Activity    Alcohol use: Yes    Alcohol/week: 1.0 standard drink of alcohol    Types: 1 Glasses of wine per week   Drug use: Never   Sexual activity: Not Currently    Birth control/protection: I.U.D.  Other Topics Concern   Not on file  Social History Narrative   Not on file   Social Drivers of Health   Financial Resource Strain: Low Risk  (09/01/2018)   Overall Financial Resource Strain (CARDIA)    Difficulty of Paying Living Expenses: Not hard at all  Food Insecurity: No Food Insecurity (09/01/2018)   Hunger Vital Sign    Worried About Running Out of Food in the Last Year: Never true    Ran Out of Food in the Last Year: Never true  Transportation Needs: No Transportation Needs (09/01/2018)   PRAPARE - Administrator, Civil Service (Medical): No    Lack of Transportation (Non-Medical): No  Physical Activity: Inactive (09/01/2018)   Exercise Vital Sign    Days of Exercise per Week: 0 days    Minutes of Exercise per Session: 0 min  Stress: No Stress Concern Present (09/01/2018)   [employer] of Occupational Health -  Occupational Stress Questionnaire    Feeling of Stress : Not at all  Social Connections: Moderately Isolated (09/01/2018)   Social Connection and Isolation Panel [NHANES]    Frequency of Communication with Friends and Family: Three times a week    Frequency of Social Gatherings with Friends and Family: Three times a week    Attends Religious Services: Never    Active Member of Clubs or Organizations: No    Attends Banker Meetings: Never    Marital Status: Married  Catering manager Violence: Not At Risk (09/01/2018)   Humiliation, Afraid, Rape, and Kick questionnaire    Fear of Current or Ex-Partner: No    Emotionally Abused: No    Physically Abused: No    Sexually Abused: No   Family Status  Relation Name Status   Mother  Alive   Father  Alive   Sister  Deceased   Brother  Alive   MGF  Deceased   PGM  Deceased   PGF  Deceased   Other   Deceased  No partnership data on file   Family History  Problem Relation Age of Onset   Depression Brother    Suicidality Brother    Allergies  Allergen Reactions   Pineapple Hives    Patient Care Team: Elia Nunley, Bonna Gains, NP as PCP - General (Internal Medicine)   Medications: Outpatient Medications Prior to Visit  Medication Sig   albuterol (PROAIR HFA) 108 (90 Base) MCG/ACT inhaler Inhale 1-2 puffs into the lungs every 6 (six) hours as needed for wheezing or shortness of breath.   azelastine (ASTELIN) 0.1 % nasal spray Place into both nostrils.   fluticasone (FLONASE) 50 MCG/ACT nasal spray fluticasone propionate 50 mcg/actuation nasal spray,suspension   Fluticasone-Salmeterol (ADVAIR) 250-50 MCG/DOSE AEPB Advair Diskus 250 mcg-50 mcg/dose powder for inhalation   levonorgestrel (MIRENA, 52 MG,) 20 MCG/24HR IUD Mirena 20 mcg/24 hours (6 yrs) 52 mg intrauterine device  Take 1 device by intrauterine route.   predniSONE (STERAPRED UNI-PAK 21 TAB) 10 MG (21) TBPK tablet As directed   promethazine-dextromethorphan (PROMETHAZINE-DM) 6.25-15 MG/5ML syrup Take by mouth.   No facility-administered medications prior to visit.    Review of Systems  Constitutional:  Negative for activity change, appetite change and unexpected weight change.  Respiratory: Negative.    Cardiovascular: Negative.   Gastrointestinal: Negative.   Endocrine: Negative for cold intolerance and heat intolerance.  Genitourinary: Negative.   Musculoskeletal: Negative.   Skin: Negative.   Neurological: Negative.   Hematological: Negative.   Psychiatric/Behavioral:  Negative for behavioral problems, decreased concentration, dysphoric mood, hallucinations, self-injury, sleep disturbance and suicidal ideas. The patient is not nervous/anxious.         Objective:  BP 117/80 (BP Location: Left Arm, Patient Position: Sitting, Cuff Size: Large)   Pulse 82   Temp 98.5 F (36.9 C) (Temporal)   Resp 18   Ht 5\' 2"   (1.575 m)   Wt 199 lb 9.6 oz (90.5 kg)   LMP  (Exact Date)   SpO2 100%   BMI 36.51 kg/m     Physical Exam Vitals and nursing note reviewed.  Constitutional:      General: She is not in acute distress. HENT:     Right Ear: Tympanic membrane, ear canal and external ear normal.     Left Ear: Tympanic membrane, ear canal and external ear normal.     Nose: Nose normal.  Eyes:     Extraocular Movements: Extraocular movements intact.  Conjunctiva/sclera: Conjunctivae normal.     Pupils: Pupils are equal, round, and reactive to light.  Neck:     Thyroid: No thyroid mass, thyromegaly or thyroid tenderness.  Cardiovascular:     Rate and Rhythm: Normal rate and regular rhythm.     Pulses: Normal pulses.     Heart sounds: Normal heart sounds.  Pulmonary:     Effort: Pulmonary effort is normal.     Breath sounds: Normal breath sounds.  Abdominal:     General: Bowel sounds are normal.     Palpations: Abdomen is soft.  Musculoskeletal:        General: Normal range of motion.     Cervical back: Normal range of motion and neck supple.     Right lower leg: No edema.     Left lower leg: No edema.  Lymphadenopathy:     Cervical: No cervical adenopathy.  Skin:    General: Skin is warm and dry.  Neurological:     Mental Status: She is alert and oriented to person, place, and time.     Cranial Nerves: No cranial nerve deficit.  Psychiatric:        Mood and Affect: Mood normal.        Behavior: Behavior normal.        Thought Content: Thought content normal.      No results found for any visits on 04/02/23.    Assessment & Plan:    Routine Health Maintenance and Physical Exam  Immunization History  Administered Date(s) Administered   Influenza, Seasonal, Injecte, Preservative Fre 04/02/2023   Influenza,inj,Quad PF,6+ Mos 01/22/2019, 03/28/2021, 10/27/2021   Moderna Sars-Covid-2 Vaccination 05/13/2019, 06/10/2019, 12/29/2019, 02/16/2021   PNEUMOCOCCAL CONJUGATE-20 04/02/2023    Tdap 03/28/2021   Unspecified SARS-COV-2 Vaccination 04/02/2022   Health Maintenance  Topic Date Due   Colonoscopy  Never done   COVID-19 Vaccine (6 - 2024-25 season) 06/03/2023 (Originally 11/04/2022)   Hepatitis C Screening  04/01/2024 (Originally 07/18/1995)   HIV Screening  04/01/2024 (Originally 07/17/1992)   Cervical Cancer Screening (HPV/Pap Cotest)  10/30/2025   DTaP/Tdap/Td (2 - Td or Tdap) 03/29/2031   Pneumococcal Vaccine 80-66 Years old  Completed   INFLUENZA VACCINE  Completed   HPV VACCINES  Aged Out   Discussed health benefits of physical activity, and encouraged her to engage in regular exercise appropriate for her age and condition.  Problem List Items Addressed This Visit     Elevated LDL cholesterol level   Relevant Orders   Lipid panel   TSH   Other Visit Diagnoses       Encounter for preventative adult health care exam with abnormal findings    -  Primary   Relevant Orders   Comprehensive metabolic panel     Immunization due       Relevant Orders   Flu vaccine trivalent PF, 6mos and older(Flulaval,Afluria,Fluarix,Fluzone) (Completed)   Pneumococcal conjugate vaccine 20-valent (Prevnar 20) (Completed)     Colon cancer screening       Relevant Orders   Ambulatory referral to Gastroenterology      Return in about 1 year (around 04/01/2024) for CPE (fasting).     Alysia Penna, NP

## 2023-04-05 ENCOUNTER — Encounter: Payer: Self-pay | Admitting: Nurse Practitioner

## 2023-05-28 DIAGNOSIS — Z30431 Encounter for routine checking of intrauterine contraceptive device: Secondary | ICD-10-CM | POA: Diagnosis not present

## 2023-05-28 DIAGNOSIS — Z30433 Encounter for removal and reinsertion of intrauterine contraceptive device: Secondary | ICD-10-CM | POA: Diagnosis not present

## 2023-05-28 DIAGNOSIS — Z3202 Encounter for pregnancy test, result negative: Secondary | ICD-10-CM | POA: Diagnosis not present

## 2024-02-05 DIAGNOSIS — K080 Exfoliation of teeth due to systemic causes: Secondary | ICD-10-CM | POA: Diagnosis not present

## 2024-02-25 DIAGNOSIS — R509 Fever, unspecified: Secondary | ICD-10-CM | POA: Diagnosis not present

## 2024-02-25 DIAGNOSIS — R519 Headache, unspecified: Secondary | ICD-10-CM | POA: Diagnosis not present

## 2024-02-25 DIAGNOSIS — J45998 Other asthma: Secondary | ICD-10-CM | POA: Diagnosis not present

## 2024-02-25 DIAGNOSIS — R051 Acute cough: Secondary | ICD-10-CM | POA: Diagnosis not present
